# Patient Record
Sex: Female | Born: 1990 | Hispanic: No | Marital: Married | Smoking: Current every day smoker
Health system: Southern US, Community
[De-identification: ages and names within clinical notes are randomized; demographics above are authoritative.]

## PROBLEM LIST (undated history)

## (undated) DIAGNOSIS — Z789 Other specified health status: Secondary | ICD-10-CM

---
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
## 2012-04-25 ENCOUNTER — Emergency Department (HOSPITAL_COMMUNITY)
Admission: EM | Admit: 2012-04-25 | Discharge: 2012-04-25 | Disposition: A | Discharge status: Home / Self Care | Payer: No Typology Code available for payment source | Attending: Emergency Medicine | Admitting: Emergency Medicine

## 2012-04-25 ENCOUNTER — Encounter (HOSPITAL_COMMUNITY): Payer: Self-pay | Admitting: Emergency Medicine

## 2012-04-25 DIAGNOSIS — Z888 Allergy status to other drugs, medicaments and biological substances status: Secondary | ICD-10-CM | POA: Insufficient documentation

## 2012-04-25 DIAGNOSIS — Y92410 Unspecified street and highway as the place of occurrence of the external cause: Secondary | ICD-10-CM | POA: Insufficient documentation

## 2012-04-25 DIAGNOSIS — S139XXA Sprain of joints and ligaments of unspecified parts of neck, initial encounter: Secondary | ICD-10-CM | POA: Insufficient documentation

## 2012-04-25 DIAGNOSIS — F17200 Nicotine dependence, unspecified, uncomplicated: Secondary | ICD-10-CM | POA: Insufficient documentation

## 2012-04-25 DIAGNOSIS — S161XXA Strain of muscle, fascia and tendon at neck level, initial encounter: Secondary | ICD-10-CM

## 2012-04-25 DIAGNOSIS — S0990XA Unspecified injury of head, initial encounter: Secondary | ICD-10-CM

## 2012-04-25 MED ORDER — IBUPROFEN 600 MG PO TABS: 600.0000 mg | ORAL_TABLET | Freq: Three times a day (TID) | ORAL | Status: DC

## 2012-04-25 MED ORDER — IBUPROFEN 400 MG PO TABS
400.0000 mg | ORAL_TABLET | Freq: Once | ORAL | Status: AC
  Administered 2012-04-25: 400 mg via ORAL
  Filled 2012-04-25: qty 1

## 2012-04-25 MED ORDER — HYDROCODONE-ACETAMINOPHEN 5-325 MG PO TABS: ORAL_TABLET | ORAL | Status: DC

## 2012-04-25 MED ORDER — CYCLOBENZAPRINE HCL 5 MG PO TABS: 5.0000 mg | ORAL_TABLET | Freq: Three times a day (TID) | ORAL | Status: DC | PRN

## 2012-04-25 MED ORDER — CYCLOBENZAPRINE HCL 10 MG PO TABS
10.0000 mg | ORAL_TABLET | Freq: Once | ORAL | Status: AC
  Administered 2012-04-25: 10 mg via ORAL
  Filled 2012-04-25: qty 1

## 2012-04-25 MED ORDER — HYDROCODONE-ACETAMINOPHEN 5-325 MG PO TABS
1.0000 | ORAL_TABLET | Freq: Once | ORAL | Status: AC
  Administered 2012-04-25: 1 via ORAL
  Filled 2012-04-25: qty 1
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
## 2012-04-25 NOTE — Discharge Instructions (Signed)
Cervical Sprain A cervical sprain is an injury in the neck in which the ligaments are stretched or torn. The ligaments are the tissues that hold the bones of the neck (vertebrae) in place.Cervical sprains can range from very mild to very severe. Most cervical sprains get better in 1 to 3 weeks, but it depends on the cause and extent of the injury. Severe cervical sprains can cause the neck vertebrae to be unstable. This can lead to damage of the spinal cord and can result in serious nervous system problems. Your caregiver will determine whether your cervical sprain is mild or severe. CAUSES  Severe cervical sprains may be caused by:  Contact sport injuries (football, rugby, wrestling, hockey, auto racing, gymnastics, diving, martial arts, boxing).   Motor vehicle collisions.   Whiplash injuries. This means the neck is forcefully whipped backward and forward.   Falls.  Mild cervical sprains may be caused by:   Awkward positions, such as cradling a telephone between your ear and shoulder.   Sitting in a chair that does not offer proper support.   Working at a poorly Marketing executive station.   Activities that require looking up or down for long periods of time.  SYMPTOMS   Pain, soreness, stiffness, or a burning sensation in the front, back, or sides of the neck. This discomfort may develop immediately after injury or it may develop slowly and not begin for 24 hours or more after an injury.   Pain or tenderness directly in the middle of the back of the neck.   Shoulder or upper back pain.   Limited ability to move the neck.   Headache.   Dizziness.   Weakness, numbness, or tingling in the hands or arms.   Muscle spasms.   Difficulty swallowing or chewing.   Tenderness and swelling of the neck.  DIAGNOSIS  Most of the time, your caregiver can diagnose this problem by taking your history and doing a physical exam. Your caregiver will ask about any known problems, such as  arthritis in the neck or a previous neck injury. X-rays may be taken to find out if there are any other problems, such as problems with the bones of the neck. However, an X-ray often does not reveal the full extent of a cervical sprain. Other tests such as a computed tomography (CT) scan or magnetic resonance imaging (MRI) may be needed. TREATMENT  Treatment depends on the severity of the cervical sprain. Mild sprains can be treated with rest, keeping the neck in place (immobilization), and pain medicines. Severe cervical sprains need immediate immobilization and an appointment with an orthopedist or neurosurgeon. Several treatment options are available to help with pain, muscle spasms, and other symptoms. Your caregiver may prescribe:  Medicines, such as pain relievers, numbing medicines, or muscle relaxants.   Physical therapy. This can include stretching exercises, strengthening exercises, and posture training. Exercises and improved posture can help stabilize the neck, strengthen muscles, and help stop symptoms from returning.   A neck collar to be worn for short periods of time. Often, these collars are worn for comfort. However, certain collars may be worn to protect the neck and prevent further worsening of a serious cervical sprain.  HOME CARE INSTRUCTIONS   Put ice on the injured area.   Put ice in a plastic bag.   Place a towel between your skin and the bag.   Leave the ice on for 15 to 20 minutes, 3 to 4 times a day.  Only take over-the-counter or prescription medicines for pain, discomfort, or fever as directed by your caregiver.   Keep all follow-up appointments as directed by your caregiver.   Keep all physical therapy appointments as directed by your caregiver.   If a neck collar is prescribed, wear it as directed by your caregiver.   Do not drive while wearing a neck collar.   Make any needed adjustments to your work station to promote good posture.   Avoid positions  and activities that make your symptoms worse.   Warm up and stretch before being active to help prevent problems.  SEEK MEDICAL CARE IF:   Your pain is not controlled with medicine.   You are unable to decrease your pain medicine over time as planned.   Your activity level is not improving as expected.  SEEK IMMEDIATE MEDICAL CARE IF:   You develop any bleeding, stomach upset, or signs of an allergic reaction to your medicine.   Your symptoms get worse.   You develop new, unexplained symptoms.   You have numbness, tingling, weakness, or paralysis in any part of your body.  MAKE SURE YOU:   Understand these instructions.   Will watch your condition.   Will get help right away if you are not doing well or get worse.  Document Released: 05/16/2007 Document Revised: 07/08/2011 Document Reviewed: 04/21/2011 [employer] Patient Information 2012 [place], [place].    Head Injury, Adult You have had a head injury that does not appear serious at this time. A concussion is a state of changed mental ability, usually from a blow to the head. You should take clear liquids for the rest of the day and then resume your regular diet. You should not take sedatives or alcoholic beverages for as long as directed by your caregiver after discharge. After injuries such as yours, most problems occur within the first 24 hours. SYMPTOMS These minor symptoms may be experienced after discharge:  Memory difficulties.   Dizziness.   Headaches.   Double vision.   Hearing difficulties.   Depression.   Tiredness.   Weakness.   Difficulty with concentration.  If you experience any of these problems, you should not be alarmed. A concussion requires a few days for recovery. Many patients with head injuries frequently experience such symptoms. Usually, these problems disappear without medical care. If symptoms last for more than one day, notify your caregiver. See your caregiver sooner if symptoms are  becoming worse rather than better. HOME CARE INSTRUCTIONS   During the next 24 hours you must stay with someone who can watch you for the warning signs listed below.  Although it is unlikely that serious side effects will occur, you should be aware of signs and symptoms which may necessitate your return to this location. Side effects may occur up to 7 - 10 days following the injury. It is important for you to carefully monitor your condition and contact your caregiver or seek immediate medical attention if there is a change in your condition. SEEK IMMEDIATE MEDICAL CARE IF:   There is confusion or drowsiness.   You can not awaken the injured person.   There is nausea (feeling sick to your stomach) or continued, forceful vomiting.   You notice dizziness or unsteadiness which is getting worse, or inability to walk.   You have convulsions or unconsciousness.   You experience severe, persistent headaches not relieved by over-the-counter or prescription medicines for pain. (Do not take aspirin as this impairs clotting abilities). Take other  pain medications only as directed.   You can not use arms or legs normally.   There is clear or bloody discharge from the nose or ears.  MAKE SURE YOU:   Understand these instructions.   Will watch your condition.   Will get help right away if you are not doing well or get worse.  Document Released: 07/19/2005 Document Revised: 07/08/2011 Document Reviewed: 06/06/2009 [facility] Patient Information 2012 [place], [place].    Narcotic and benzodiazepine use may cause drowsiness, slowed breathing or dependence.  Please use with caution and do not drive, operate machinery or watch young children alone while taking them.  Taking combinations of these medications or drinking alcohol will potentiate these effects.
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
## 2013-05-29 ENCOUNTER — Encounter (INDEPENDENT_AMBULATORY_CARE_PROVIDER_SITE_OTHER): Payer: Self-pay

## 2013-05-29 ENCOUNTER — Ambulatory Visit (INDEPENDENT_AMBULATORY_CARE_PROVIDER_SITE_OTHER): Payer: BC Managed Care – PPO | Admitting: Family Medicine

## 2013-05-29 VITALS — BP 119/72 | HR 68 | Temp 97.1°F | Wt 167.8 lb

## 2013-05-29 DIAGNOSIS — R51 Headache: Secondary | ICD-10-CM

## 2013-05-29 DIAGNOSIS — R05 Cough: Secondary | ICD-10-CM

## 2013-05-29 DIAGNOSIS — R509 Fever, unspecified: Secondary | ICD-10-CM

## 2013-05-29 DIAGNOSIS — J029 Acute pharyngitis, unspecified: Secondary | ICD-10-CM

## 2013-05-29 LAB — POCT INFLUENZA A/B
Influenza A, POC: NEGATIVE
Influenza B, POC: NEGATIVE

## 2013-05-29 LAB — POCT RAPID STREP A (OFFICE): Rapid Strep A Screen: NEGATIVE

## 2013-05-29 MED ORDER — AZITHROMYCIN 250 MG PO TABS: ORAL_TABLET | ORAL | Status: DC

## 2013-05-29 NOTE — Progress Notes (Signed)
  Subjective:    Patient ID: Marilyn Thompson, female    DOB: February 21, 1991, 22 y.o.   MRN: [id number]  HPI This 22 y.o. female presents for evaluation of URI sx's.  She is having some fever Chills, body aches, and cough.  Her cough is productive.  She is a smoker.   Review of Systems C/o congestion, cough. No chest pain, SOB, HA, dizziness, vision change, N/V, diarrhea, constipation, dysuria, urinary urgency or frequency, myalgias, arthralgias or rash.     Objective:   Physical Exam Vital signs noted  Well developed well nourished female.  HEENT - Head atraumatic Normocephalic                Eyes - PERRLA, Conjuctiva - clear Sclera- Clear EOMI                Ears - EAC's Wnl TM's Wnl Gross Hearing WNL                Nose - Nares patent                 Throat - oropharanx wnl Respiratory - Lungs CTA bilateral Cardiac - RRR S1 and S2 without murmur GI - Abdomen soft Nontender and bowel sounds active x 4 Extremities - No edema. Neuro - Grossly intact.       Assessment & Plan:  Cough - Plan: POCT rapid strep A, POCT Influenza A/B, azithromycin (ZITHROMAX) 250 MG tablet  Fever - Plan: POCT rapid strep A, POCT Influenza A/B, azithromycin (ZITHROMAX) 250 MG tablet  Sore throat - Plan: POCT rapid strep A, POCT Influenza A/B, azithromycin (ZITHROMAX) 250 MG tablet  Headache(784.0) - Plan: POCT rapid strep A, POCT Influenza A/B, azithromycin (ZITHROMAX) 250 MG tablet  Push po fluids, rest, tylenol and motrin otc prn as directed.  WSWG's.    Deatra Canter FNP

## 2013-05-29 NOTE — Patient Instructions (Signed)

## 2015-08-03 NOTE — L&D Delivery Note (Signed)
Delivery Note At 12:05 PM a viable female was delivered via Vaginal, Spontaneous Delivery (Presentation: ROA  ).  APGAR: 8, 9; weight pending. Placenta status: S/I  Cord: 3V with the following complications: none.  Cord pH: n/a  Anesthesia: IV fentanyl  Episiotomy: None Lacerations: Right Labial Suture Repair: 3.0 vicryl rapide Est. Blood Loss (mL): 300  Mom to postpartum.  Baby to Couplet care / Skin to Skin.  Jasan Doughtie 03/10/2016, 12:32 PM

## 2015-08-07 LAB — OB RESULTS CONSOLE HIV ANTIBODY (ROUTINE TESTING): HIV: NONREACTIVE

## 2015-08-07 LAB — OB RESULTS CONSOLE ABO/RH: RH TYPE: NEGATIVE

## 2015-08-07 LAB — OB RESULTS CONSOLE HEPATITIS B SURFACE ANTIGEN: HEP B S AG: NEGATIVE

## 2015-08-07 LAB — OB RESULTS CONSOLE RPR: RPR: NONREACTIVE

## 2015-08-07 LAB — OB RESULTS CONSOLE ANTIBODY SCREEN: Antibody Screen: NEGATIVE

## 2015-08-07 LAB — OB RESULTS CONSOLE RUBELLA ANTIBODY, IGM: RUBELLA: IMMUNE

## 2016-03-01 LAB — OB RESULTS CONSOLE GBS: STREP GROUP B AG: NEGATIVE

## 2016-03-05 ENCOUNTER — Inpatient Hospital Stay (HOSPITAL_COMMUNITY)
Admission: AD | Admit: 2016-03-05 | Discharge: 2016-03-05 | Disposition: A | Discharge status: Home / Self Care | Payer: Commercial Managed Care - PPO | Source: Ambulatory Visit | Attending: Obstetrics & Gynecology | Admitting: Obstetrics & Gynecology

## 2016-03-05 ENCOUNTER — Encounter (HOSPITAL_COMMUNITY): Payer: Self-pay

## 2016-03-05 DIAGNOSIS — Z3493 Encounter for supervision of normal pregnancy, unspecified, third trimester: Secondary | ICD-10-CM | POA: Insufficient documentation

## 2016-03-05 DIAGNOSIS — Z3A38 38 weeks gestation of pregnancy: Secondary | ICD-10-CM | POA: Insufficient documentation

## 2016-03-05 LAB — AMNISURE RUPTURE OF MEMBRANE (ROM) NOT AT ARMC: AMNISURE: NEGATIVE

## 2016-03-05 NOTE — MAU Note (Signed)
Urine in lab 

## 2016-03-05 NOTE — MAU Note (Signed)
Pt received to MAU c/o possible rupture of membranes. Pt states that she has felt a "wet" feeling since 1200 yesterday. Patient denies vaginal bleeding or contraction. Pt placed on EFM FHR 150. Carmelina Dane, RN

## 2016-03-08 ENCOUNTER — Encounter (HOSPITAL_COMMUNITY): Payer: Self-pay

## 2016-03-08 ENCOUNTER — Inpatient Hospital Stay (HOSPITAL_COMMUNITY)
Admission: AD | Admit: 2016-03-08 | Discharge: 2016-03-08 | Disposition: A | Discharge status: Home / Self Care | Payer: Commercial Managed Care - PPO | Source: Ambulatory Visit | Attending: Obstetrics and Gynecology | Admitting: Obstetrics and Gynecology

## 2016-03-08 DIAGNOSIS — Z3A38 38 weeks gestation of pregnancy: Secondary | ICD-10-CM | POA: Insufficient documentation

## 2016-03-08 DIAGNOSIS — O471 False labor at or after 37 completed weeks of gestation: Secondary | ICD-10-CM | POA: Insufficient documentation

## 2016-03-08 HISTORY — DX: Other specified health status: Z78.9

## 2016-03-08 NOTE — MAU Note (Signed)
Pt c/o contractions every 3 minutes or closer since 8am. Pt denies bleeding and leaking of fluid. Pt states baby is moving normally.

## 2016-03-09 ENCOUNTER — Inpatient Hospital Stay (HOSPITAL_COMMUNITY)
Admission: AD | Admit: 2016-03-09 | Discharge: 2016-03-11 | DRG: 775 | Disposition: A | Discharge status: Home / Self Care | Payer: Commercial Managed Care - PPO | Source: Ambulatory Visit | Attending: Obstetrics & Gynecology | Admitting: Obstetrics & Gynecology

## 2016-03-09 DIAGNOSIS — O360930 Maternal care for other rhesus isoimmunization, third trimester, not applicable or unspecified: Secondary | ICD-10-CM | POA: Diagnosis present

## 2016-03-09 DIAGNOSIS — F17210 Nicotine dependence, cigarettes, uncomplicated: Secondary | ICD-10-CM | POA: Diagnosis present

## 2016-03-09 DIAGNOSIS — Z23 Encounter for immunization: Secondary | ICD-10-CM

## 2016-03-09 DIAGNOSIS — Z3490 Encounter for supervision of normal pregnancy, unspecified, unspecified trimester: Secondary | ICD-10-CM

## 2016-03-09 DIAGNOSIS — Z3A38 38 weeks gestation of pregnancy: Secondary | ICD-10-CM

## 2016-03-09 DIAGNOSIS — O99334 Smoking (tobacco) complicating childbirth: Secondary | ICD-10-CM | POA: Diagnosis present

## 2016-03-10 ENCOUNTER — Encounter (HOSPITAL_COMMUNITY): Payer: Self-pay | Admitting: *Deleted

## 2016-03-10 DIAGNOSIS — Z23 Encounter for immunization: Secondary | ICD-10-CM | POA: Diagnosis not present

## 2016-03-10 DIAGNOSIS — F17210 Nicotine dependence, cigarettes, uncomplicated: Secondary | ICD-10-CM | POA: Diagnosis present

## 2016-03-10 DIAGNOSIS — Z3490 Encounter for supervision of normal pregnancy, unspecified, unspecified trimester: Secondary | ICD-10-CM

## 2016-03-10 DIAGNOSIS — O99334 Smoking (tobacco) complicating childbirth: Secondary | ICD-10-CM | POA: Diagnosis present

## 2016-03-10 DIAGNOSIS — O360930 Maternal care for other rhesus isoimmunization, third trimester, not applicable or unspecified: Secondary | ICD-10-CM | POA: Diagnosis present

## 2016-03-10 DIAGNOSIS — Z3A38 38 weeks gestation of pregnancy: Secondary | ICD-10-CM | POA: Diagnosis not present

## 2016-03-10 LAB — CBC
HEMATOCRIT: 35.3 % — AB (ref 36.0–46.0)
HEMOGLOBIN: 12.2 g/dL (ref 12.0–15.0)
MCH: 30.4 pg (ref 26.0–34.0)
MCHC: 34.6 g/dL (ref 30.0–36.0)
MCV: 88 fL (ref 78.0–100.0)
Platelets: 237 10*3/uL (ref 150–400)
RBC: 4.01 MIL/uL (ref 3.87–5.11)
RDW: 13.4 % (ref 11.5–15.5)
WBC: 10.2 10*3/uL (ref 4.0–10.5)

## 2016-03-10 LAB — AMNISURE RUPTURE OF MEMBRANE (ROM) NOT AT ARMC: Amnisure ROM: NEGATIVE

## 2016-03-10 MED ORDER — WITCH HAZEL-GLYCERIN EX PADS: 1.0000 "application " | MEDICATED_PAD | CUTANEOUS | Status: DC | PRN

## 2016-03-10 MED ORDER — OXYTOCIN BOLUS FROM INFUSION: 500.0000 mL | Freq: Once | INTRAVENOUS | Status: DC

## 2016-03-10 MED ORDER — FLEET ENEMA 7-19 GM/118ML RE ENEM: 1.0000 | ENEMA | RECTAL | Status: DC | PRN

## 2016-03-10 MED ORDER — OXYCODONE-ACETAMINOPHEN 5-325 MG PO TABS: 2.0000 | ORAL_TABLET | ORAL | Status: DC | PRN

## 2016-03-10 MED ORDER — ONDANSETRON HCL 4 MG PO TABS: 4.0000 mg | ORAL_TABLET | ORAL | Status: DC | PRN

## 2016-03-10 MED ORDER — BENZOCAINE-MENTHOL 20-0.5 % EX AERO
1.0000 "application " | INHALATION_SPRAY | CUTANEOUS | Status: DC | PRN
  Administered 2016-03-10: 1 via TOPICAL
  Filled 2016-03-10: qty 56

## 2016-03-10 MED ORDER — LIDOCAINE HCL (PF) 1 % IJ SOLN
30.0000 mL | INTRAMUSCULAR | Status: DC | PRN
  Filled 2016-03-10: qty 30

## 2016-03-10 MED ORDER — ZOLPIDEM TARTRATE 5 MG PO TABS: 5.0000 mg | ORAL_TABLET | Freq: Every evening | ORAL | Status: DC | PRN

## 2016-03-10 MED ORDER — SOD CITRATE-CITRIC ACID 500-334 MG/5ML PO SOLN: 30.0000 mL | ORAL | Status: DC | PRN

## 2016-03-10 MED ORDER — ONDANSETRON HCL 4 MG/2ML IJ SOLN: 4.0000 mg | Freq: Four times a day (QID) | INTRAMUSCULAR | Status: DC | PRN

## 2016-03-10 MED ORDER — ONDANSETRON HCL 4 MG/2ML IJ SOLN: 4.0000 mg | INTRAMUSCULAR | Status: DC | PRN

## 2016-03-10 MED ORDER — ACETAMINOPHEN 325 MG PO TABS: 650.0000 mg | ORAL_TABLET | ORAL | Status: DC | PRN

## 2016-03-10 MED ORDER — LACTATED RINGERS IV SOLN
INTRAVENOUS | Status: DC
  Administered 2016-03-10: 06:00:00 via INTRAVENOUS

## 2016-03-10 MED ORDER — TETANUS-DIPHTH-ACELL PERTUSSIS 5-2.5-18.5 LF-MCG/0.5 IM SUSP: 0.5000 mL | Freq: Once | INTRAMUSCULAR | Status: DC

## 2016-03-10 MED ORDER — DIPHENHYDRAMINE HCL 25 MG PO CAPS: 25.0000 mg | ORAL_CAPSULE | Freq: Four times a day (QID) | ORAL | Status: DC | PRN

## 2016-03-10 MED ORDER — LACTATED RINGERS IV SOLN
500.0000 mL | INTRAVENOUS | Status: DC | PRN
  Administered 2016-03-10: 500 mL via INTRAVENOUS

## 2016-03-10 MED ORDER — OXYTOCIN 40 UNITS IN LACTATED RINGERS INFUSION - SIMPLE MED
1.0000 m[IU]/min | INTRAVENOUS | Status: DC
  Administered 2016-03-10: 1 m[IU]/min via INTRAVENOUS
  Filled 2016-03-10: qty 1000

## 2016-03-10 MED ORDER — ACETAMINOPHEN 325 MG PO TABS
650.0000 mg | ORAL_TABLET | ORAL | Status: DC | PRN
  Administered 2016-03-10: 650 mg via ORAL
  Filled 2016-03-10: qty 2

## 2016-03-10 MED ORDER — PRENATAL MULTIVITAMIN CH
1.0000 | ORAL_TABLET | Freq: Every day | ORAL | Status: DC
  Administered 2016-03-10 – 2016-03-11 (×3): 1 via ORAL
  Filled 2016-03-10 (×2): qty 1

## 2016-03-10 MED ORDER — SENNOSIDES-DOCUSATE SODIUM 8.6-50 MG PO TABS
2.0000 | ORAL_TABLET | ORAL | Status: DC
  Administered 2016-03-10: 2 via ORAL
  Filled 2016-03-10: qty 2

## 2016-03-10 MED ORDER — OXYCODONE-ACETAMINOPHEN 5-325 MG PO TABS: 1.0000 | ORAL_TABLET | ORAL | Status: DC | PRN

## 2016-03-10 MED ORDER — DIBUCAINE 1 % RE OINT: 1.0000 "application " | TOPICAL_OINTMENT | RECTAL | Status: DC | PRN

## 2016-03-10 MED ORDER — OXYTOCIN 40 UNITS IN LACTATED RINGERS INFUSION - SIMPLE MED: 2.5000 [IU]/h | INTRAVENOUS | Status: DC

## 2016-03-10 MED ORDER — COCONUT OIL OIL: 1.0000 "application " | TOPICAL_OIL | Status: DC | PRN

## 2016-03-10 MED ORDER — OXYCODONE-ACETAMINOPHEN 5-325 MG PO TABS
1.0000 | ORAL_TABLET | ORAL | Status: DC | PRN
Start: 2016-03-10 — End: 2016-03-11

## 2016-03-10 MED ORDER — IBUPROFEN 600 MG PO TABS
600.0000 mg | ORAL_TABLET | Freq: Four times a day (QID) | ORAL | Status: DC
  Administered 2016-03-10 – 2016-03-11 (×5): 600 mg via ORAL
  Filled 2016-03-10 (×5): qty 1

## 2016-03-10 MED ORDER — SIMETHICONE 80 MG PO CHEW: 80.0000 mg | CHEWABLE_TABLET | ORAL | Status: DC | PRN

## 2016-03-10 MED ORDER — FENTANYL CITRATE (PF) 100 MCG/2ML IJ SOLN
50.0000 ug | INTRAMUSCULAR | Status: DC | PRN
  Administered 2016-03-10: 100 ug via INTRAVENOUS
  Filled 2016-03-10: qty 2

## 2016-03-10 MED ORDER — TERBUTALINE SULFATE 1 MG/ML IJ SOLN: 0.2500 mg | Freq: Once | INTRAMUSCULAR | Status: DC | PRN

## 2016-03-10 NOTE — Progress Notes (Signed)
I was called to patient's room due to prolonged fetal deceleration. Pt in knee chest position with Oxygen 10L via mask. FHT currently in 110s. SVE performed by RN prior to my arrival & pt found to be 3/70/-3 vtx.  I called Dr. Vincente PoliGrewal for the RN & informed her of fetal deceleration, current FHT, & SVE. Will admit pt to L&D.   Judeth HornErin Gazella Anglin, NP

## 2016-03-10 NOTE — Anesthesia Pain Management Evaluation Note (Signed)
  CRNA Pain Management Visit Note  Patient: Marilyn Thompson, 25 y.o., female  "Hello I am a member of the anesthesia team at [facility]. We have an anesthesia team available at all times to provide care throughout the hospital, including epidural management and anesthesia for C-section. I don't know your plan for the delivery whether it a natural birth, water birth, IV sedation, nitrous supplementation, doula or epidural, but we want to meet your pain goals."   1.Was your pain managed to your expectations on prior hospitalizations?   Yes   2.What is your expectation for pain management during this hospitalization?     Epidural  3.How can we help you reach that goal?  Explain options for pain control in labor  Record the patient's initial score and the patient's pain goal.   Pain: 3  Pain Goal: 8 [facility] wants you to be able to say your pain was always managed very well.  Modesta Sammons 03/10/2016

## 2016-03-10 NOTE — H&P (Signed)
Elonda HuskyCassandra Tonche is a 25 y.o. female presenting for questionable PROM to MAU last night.  ROM was ruled out but during eval, the patient had a 7 minute deceleration and was admitted for induction.  Her CEFM has been reassuring since that time. The patient was started on low dose pitocin overnight and is feeling mild CTX this AM.  Active FM. Antepartum course complicated by tobacco use.  Rh negative.  GBS negative.  OB History    Gravida Para Term Preterm AB Living   2 0 0 0 1 0   SAB TAB Ectopic Multiple Live Births     1           Past Medical History:  Diagnosis Date  . Medical history non-contributory    Past Surgical History:  Procedure Laterality Date  . TONSILLECTOMY  1994   Family History: family history includes Cancer in her paternal grandmother; Diabetes in her paternal grandmother. Social History:  reports that she has been smoking Cigarettes.  She has a 2.25 pack-year smoking history. She has never used smokeless tobacco. She reports that she drinks alcohol. She reports that she does not use drugs.     Maternal Diabetes: No Genetic Screening: Normal Maternal Ultrasounds/Referrals: Normal Fetal Ultrasounds or other Referrals:  None Maternal Substance Abuse:  Yes:  Type: Smoker Significant Maternal Medications:  None Significant Maternal Lab Results:  Lab values include: Group B Strep negative, Rh negative Other Comments:  None  ROS Maternal Medical History:  Reason for admission: Contractions.   Contractions: Onset was 6-12 hours ago.   Frequency: rare.   Perceived severity is mild.    Fetal activity: Perceived fetal activity is normal.   Last perceived fetal movement was within the past hour.    Prenatal complications: no prenatal complications Prenatal Complications - Diabetes: none.    Dilation: 3 Effacement (%): Thick Station: -3 Exam by:: Rogelio SeenMcCall, RN   AROM, clear  Blood pressure (!) 95/53, pulse (!) 56, temperature 97.7 F (36.5 C), temperature  source Oral, resp. rate 16, height 5\' 5"  (1.651 m), weight 211 lb (95.7 kg), last menstrual period 06/27/2015. Maternal Exam:  Uterine Assessment: Contraction strength is mild.  Contraction frequency is irregular.   Abdomen: Patient reports no abdominal tenderness. Fundal height is c/w dates.   Estimated fetal weight is 7#.   Fetal presentation: vertex  Introitus: Normal vulva. Amniotic fluid character: clear.  Pelvis: adequate for delivery.   Cervix: Cervix evaluated by digital exam.     Physical Exam  Constitutional: She is oriented to person, place, and time. She appears well-developed and well-nourished.  GI: Soft. There is no rebound and no guarding.  Neurological: She is alert and oriented to person, place, and time.  Skin: Skin is warm and dry.  Psychiatric: She has a normal mood and affect. Her behavior is normal.    Prenatal labs: ABO, Rh: --/--/O NEG (08/09 0040) Antibody: POS (08/09 0040) Rubella: Immune (01/05 0000) RPR: Nonreactive (01/05 0000)  HBsAg: Negative (01/05 0000)  HIV: Non-reactive (01/05 0000)  GBS: Negative (07/31 0000)   Assessment/Plan: 25yo G2P0 at 6536w5d for IOL secondary to prolonged decel  -Continue pitocin -Epidural when ready -Anticipate NSVD    Teng Decou 03/10/2016, 8:09 AM

## 2016-03-10 NOTE — Progress Notes (Signed)
MD called to review tracing with RN. Baseline FHR, variability, accelerations and 1 variable deceleration with contractions reviewed.

## 2016-03-10 NOTE — Lactation Note (Signed)
This note was copied from a baby's chart. Lactation Consultation Note Initial visit at this time.  Mom reports just finishing a feedings STS after bath.  Mom denies pain and is reporting hearing swallows with feedings and baby releases the breast on her own.  Baby is 5#15 oz and mom admits she was a smoker and plans to quit now.  LC discussed smoking, second hand smoke and affects with breastfeeding.  FOB at bedside supportive and plans to quit also.  [facility] LC resources given and discussed.  Encouraged to feed with early cues on demand.  Early newborn behavior discussed.  Hand expression demonstrated with small drop of colostrum visible. Discussed pumping, returning to work and basics of breastfeeding.   [facility] and assist as needed.  Patient Name: Girl Elonda HuskyCassandra Abdo Today's Date: 03/10/2016 Reason for consult: Initial assessment   Maternal Data Has patient been taught Hand Expression?: Yes Does the patient have breastfeeding experience prior to this delivery?: No  Feeding Feeding Type: Breast Fed Length of feed: 30 min  LATCH Score/Interventions                Intervention(s): Breastfeeding basics reviewed;Skin to skin;Position options     Lactation Tools Discussed/Used     Consult Status Consult Status: Follow-up Date: 03/11/16 Follow-up type: In-patient    Beverely RisenShoptaw, Arvella MerlesJana Lynn 03/10/2016, 9:41 PM

## 2016-03-11 ENCOUNTER — Ambulatory Visit: Payer: Self-pay

## 2016-03-11 LAB — CBC
HCT: 30.2 % — ABNORMAL LOW (ref 36.0–46.0)
Hemoglobin: 10.3 g/dL — ABNORMAL LOW (ref 12.0–15.0)
MCH: 30.3 pg (ref 26.0–34.0)
MCHC: 34.1 g/dL (ref 30.0–36.0)
MCV: 88.8 fL (ref 78.0–100.0)
PLATELETS: 196 10*3/uL (ref 150–400)
RBC: 3.4 MIL/uL — ABNORMAL LOW (ref 3.87–5.11)
RDW: 13.5 % (ref 11.5–15.5)
WBC: 10.5 10*3/uL (ref 4.0–10.5)

## 2016-03-11 LAB — RPR: RPR: NONREACTIVE

## 2016-03-11 MED ORDER — RHO D IMMUNE GLOBULIN 1500 UNIT/2ML IJ SOSY
300.0000 ug | PREFILLED_SYRINGE | Freq: Once | INTRAMUSCULAR | Status: AC
  Administered 2016-03-11: 300 ug via INTRAMUSCULAR
  Filled 2016-03-11: qty 2

## 2016-03-11 MED ORDER — IBUPROFEN 600 MG PO TABS: 600.0000 mg | ORAL_TABLET | Freq: Four times a day (QID) | ORAL | 0 refills | Status: DC | PRN

## 2016-03-11 NOTE — Progress Notes (Signed)
Patient wants discharge today. Will discharge this afternoon.

## 2016-03-11 NOTE — Lactation Note (Signed)
This note was copied from a baby's chart. Lactation Consultation Note  Patient Name: Marilyn Elonda HuskyCassandra Hughett Today's Date: 03/11/2016 Reason for consult: Follow-up assessment Baby at 32 hr of life. Mom reports baby is feeding better today. She is staying awake longer at the breast. Mom is reporting bilateral nipple soreness. No skin break down noted. Mom was using the cradle hold leaning over baby who was laying on the pillows. Had mom move to cross cradle, lean back, and bring support pillows up the breast level. Mom stated latch felt better when she pulled baby in closer. Encouraged frequent feedings and manual expression after feedings. FOB at bedside and helps with latch. Parents are aware of lactation services and support group. They will call as needed.   Maternal Data    Feeding Feeding Type: Breast Fed Length of feed: 10 min  LATCH Score/Interventions Latch: Grasps breast easily, tongue down, lips flanged, rhythmical sucking. Intervention(s): Adjust position;Breast compression  Audible Swallowing: Spontaneous and intermittent Intervention(s): Hand expression  Type of Nipple: Everted at rest and after stimulation  Comfort (Breast/Nipple): Filling, red/small blisters or bruises, mild/mod discomfort  Problem noted: Mild/Moderate discomfort Interventions (Mild/moderate discomfort): Hand expression  Hold (Positioning): No assistance needed to correctly position infant at breast. Intervention(s): Support Pillows;Position options  LATCH Score: 9  Lactation Tools Discussed/Used WIC Program: No   Consult Status Consult Status: Follow-up Date: 03/12/16 Follow-up type: In-patient    Rulon Eisenmengerlizabeth E Cydnee Fuquay 03/11/2016, 8:34 PM

## 2016-03-11 NOTE — Discharge Summary (Signed)
Obstetric Discharge Summary Reason for Admission: rupture of membranes Prenatal Procedures: none Intrapartum Procedures: spontaneous vaginal delivery Postpartum Procedures: none Complications-Operative and Postpartum: none Hemoglobin  Date Value Ref Range Status  03/11/2016 10.3 (L) 12.0 - 15.0 g/dL Final   HCT  Date Value Ref Range Status  03/11/2016 30.2 (L) 36.0 - 46.0 % Final    Physical Exam:  General: alert, cooperative and no distress Lochia: appropriate Uterine Fundus: firm Incision: healing well DVT Evaluation: No evidence of DVT seen on physical exam.  Discharge Diagnoses: Term Pregnancy-delivered  Discharge Information: Date: 03/11/2016 Activity: pelvic rest Diet: routine Medications: PNV and Ibuprofen Condition: stable Instructions: refer to practice specific booklet Discharge to: home   Newborn Data: Live born female  Birth Weight: 5 lb 15.1 oz (2695 g) APGAR: 8, 9  Home with mother.  Marilyn Thompson,Marilyn Thompson E 03/11/2016, 8:59 AM

## 2016-03-11 NOTE — Progress Notes (Signed)
Post Partum Day 1 Subjective: no complaints, up ad lib, voiding, tolerating PO and + flatus  Objective: Blood pressure 112/66, pulse 63, temperature 98 F (36.7 C), resp. rate 18, height 5\' 5"  (1.651 m), weight 211 lb (95.7 kg), last menstrual period 06/27/2015, unknown if currently breastfeeding.  Physical Exam:  General: alert and cooperative Lochia: appropriate Uterine Fundus: firm Incision: healing well DVT Evaluation: No evidence of DVT seen on physical exam. Negative Homan's sign. No cords or calf tenderness. No significant calf/ankle edema.   Recent Labs  03/10/16 0040 03/11/16 0511  HGB 12.2 10.3*  HCT 35.3* 30.2*    Assessment/Plan: Plan for discharge tomorrow   LOS: 1 day   Hannah Crill G 03/11/2016, 8:01 AM

## 2016-03-11 NOTE — Discharge Instructions (Signed)
No vaginal entry °

## 2016-03-12 ENCOUNTER — Ambulatory Visit: Payer: Self-pay

## 2016-03-12 LAB — RH IG WORKUP (INCLUDES ABO/RH)
ABO/RH(D): O NEG
Fetal Screen: NEGATIVE
Gestational Age(Wks): 38.5
Unit division: 0

## 2016-03-12 NOTE — Lactation Note (Signed)
This note was copied from a baby's chart. Lactation Consultation Note: Follow up visit with mom before DC. Reports she was very upset about giving formula during the night. Mom was able to pump 30 ml of transitional milk and gave that to baby instead of formula. Reports baby did not like formula and kept spitting it out. Reports breasts are feeling heavier this morning. Has slight crack on right nipple. Encouraged to change positions, make sure she has deep latch. Asking about nipple creams. Encouraged to use EBM or coconut oil instead. No further questions at present. Reviewed our phone number to call with questions/concerns, OP appointments and BFSG. To call prn  Patient Name: Marilyn Thompson Today's Date: 03/12/2016     Maternal Data    Feeding    LATCH Score/Interventions                      Lactation Tools Discussed/Used     Consult Status      Pamelia HoitWeeks, Haston Casebolt D 03/12/2016, 11:53 AM

## 2016-03-12 NOTE — Lactation Note (Addendum)
This note was copied from a baby's chart. Lactation Consultation Note New mom planning on d/c today. States BF going well. Has some soreness but not bad. Noted sm bruise to Rt. Nipple. Baby had a 8% weight loss. Had 11 voids, 8 stools in 42 hrs. Mom seemed agitated and had little eye contact w/LC. Stated she was good and didn't need any help and had no concerns. Discussed needing to supplement d/t weight loss and being 5.7lbs. Discussed giving colostrum or formula as supplement. Encouraged pumping to stimulate lactation d/t small SGA. Mom has DEBP at home. Agrees to pump. Mom shown how to use DEBP & how to disassemble, clean, & reassemble parts. Mom knows to pump q3h for 15-20 min. Mom more attentive and having eye contact. Came back w/supplies, mom tearful. Mom refuses Alimentum d/t she wants Similac if that is what she will supplement on at home until her milk comes in. Mom wants d/c home today. Encouraged to occasionally massage breast during feedings. LPI feeding sheet given. Encouraged BF 1st then supplement. Discussed transitional milk, and mature milk, engorgement, prevention, filling, clogged ducts, and infesctions. Mom asked about nipple cream. Dicussed BM, olive oil or coconut oil. Encouraged to cont. To document I&O until Dr. Alfonzo BeersAppt.  Patient Name: Girl Elonda HuskyCassandra Manahan Today's Date: 03/12/2016 Reason for consult: Follow-up assessment   Maternal Data    Feeding Feeding Type: Breast Fed Length of feed: 20 min  LATCH Score/Interventions       Type of Nipple: Everted at rest and after stimulation  Comfort (Breast/Nipple): Filling, red/small blisters or bruises, mild/mod discomfort  Problem noted: Mild/Moderate discomfort Interventions (Mild/moderate discomfort): Hand massage  Hold (Positioning): No assistance needed to correctly position infant at breast. Intervention(s): Support Pillows;Breastfeeding basics reviewed;Position options;Skin to skin     Lactation Tools  Discussed/Used     Consult Status Consult Status: Complete Date: 03/12/16    Charyl DancerCARVER, Bitania Shankland G 03/12/2016, 6:16 AM

## 2016-03-14 LAB — TYPE AND SCREEN
ABO/RH(D): O NEG
Antibody Screen: POSITIVE
DAT, IgG: NEGATIVE
UNIT DIVISION: 0
Unit division: 0

## 2016-03-15 ENCOUNTER — Telehealth (HOSPITAL_COMMUNITY): Payer: Self-pay

## 2016-03-15 NOTE — Telephone Encounter (Signed)
Opened in error

## 2016-03-15 NOTE — Telephone Encounter (Signed)
Nipples are sore and so patient is pumping and Bottle feeding every other feeding. The baby is now having difficulty latching to the breast.  Suggested continuing to work on latch, come to support group and/or schedule an OP visit.  Baby also gets fussy prior to a bowel movement but once she evacuates she has relief. Discussed comfort measures such as gently massaging her abdomen to help her with the discomfort.

## 2018-02-08 ENCOUNTER — Emergency Department (HOSPITAL_COMMUNITY)
Admission: EM | Admit: 2018-02-08 | Discharge: 2018-02-09 | Disposition: A | Discharge status: Home / Self Care | Payer: Commercial Managed Care - PPO | Attending: Emergency Medicine | Admitting: Emergency Medicine

## 2018-02-08 ENCOUNTER — Other Ambulatory Visit: Payer: Self-pay

## 2018-02-08 ENCOUNTER — Encounter (HOSPITAL_COMMUNITY): Payer: Self-pay | Admitting: *Deleted

## 2018-02-08 DIAGNOSIS — Z79899 Other long term (current) drug therapy: Secondary | ICD-10-CM | POA: Insufficient documentation

## 2018-02-08 DIAGNOSIS — L0591 Pilonidal cyst without abscess: Secondary | ICD-10-CM | POA: Insufficient documentation

## 2018-02-08 DIAGNOSIS — F17210 Nicotine dependence, cigarettes, uncomplicated: Secondary | ICD-10-CM | POA: Insufficient documentation

## 2018-02-08 MED ORDER — ONDANSETRON HCL 4 MG PO TABS
4.0000 mg | ORAL_TABLET | Freq: Once | ORAL | Status: AC
  Administered 2018-02-08: 4 mg via ORAL
  Filled 2018-02-08: qty 1

## 2018-02-08 MED ORDER — DOXYCYCLINE HYCLATE 100 MG PO TABS
100.0000 mg | ORAL_TABLET | Freq: Once | ORAL | Status: AC
  Administered 2018-02-08: 100 mg via ORAL
  Filled 2018-02-08: qty 1

## 2018-02-08 MED ORDER — IBUPROFEN 800 MG PO TABS
800.0000 mg | ORAL_TABLET | Freq: Once | ORAL | Status: AC
  Administered 2018-02-08: 800 mg via ORAL
  Filled 2018-02-08: qty 1

## 2018-02-08 MED ORDER — CEPHALEXIN 500 MG PO CAPS
500.0000 mg | ORAL_CAPSULE | Freq: Once | ORAL | Status: AC
  Administered 2018-02-08: 500 mg via ORAL
  Filled 2018-02-08: qty 1

## 2018-02-08 NOTE — ED Triage Notes (Signed)
Pt c/o ?cyst, tailbone pain that started 3-4 days ago, pt states that the pain has gotten worse,

## 2018-02-08 NOTE — ED Provider Notes (Signed)
[facility] EMERGENCY DEPARTMENT Provider Note   CSN: [id number] Arrival date & time: 02/08/18  2143     History   Chief Complaint Chief Complaint  Patient presents with  . Tailbone Pain    HPI Marilyn Thompson is a 27 y.o. female.  Patient is a 27 year old female who presents to the emergency department with a complaint of tailbone pain.  Patient states that 3 to 4 days ago she noted pain of the area of the tailbone.  She says she has not had any recent injury or trauma to the area.  In the last 2 days she is noticed that this area has gotten larger and more tender to palpation.  The patient states she did have a different feeling in that area after her daughter was born, but that was 2 years ago and she thought that that may been related to delivery.  No recent fever or chills.  No drainage from the area.  No operations or procedures in the buttocks or coccyx area.  Palpation and sitting makes it worse.  Nothing really makes it better.     Past Medical History:  Diagnosis Date  . Medical history non-contributory     Patient Active Problem List   Diagnosis Date Noted  . Pregnancy 03/10/2016    Past Surgical History:  Procedure Laterality Date  . TONSILLECTOMY  1994     OB History    Gravida  2   Para  1   Term  1   Preterm  0   AB  1   Living  1     SAB      TAB  1   Ectopic      Multiple  0   Live Births  1            Home Medications    Prior to Admission medications   Medication Sig Start Date End Date Taking? Authorizing Provider  ibuprofen (ADVIL,MOTRIN) 600 MG tablet Take 1 tablet (600 mg total) by mouth every 6 (six) hours as needed. 03/11/16   Harold Hedgeomblin, James, MD  pantoprazole (PROTONIX) 40 MG tablet Take 40 mg by mouth daily.    [provider]  Prenatal Vit-Fe Fumarate-FA (PRENATAL MULTIVITAMIN) TABS tablet Take 1 tablet by mouth daily at 12 noon.    [provider]    Family History Family History  Problem  Relation Age of Onset  . Cancer Paternal Grandmother   . Diabetes Paternal Grandmother     Social History Social History   Tobacco Use  . Smoking status: Current Every Day Smoker    Packs/day: 0.25    Years: 9.00    Pack years: 2.25    Types: Cigarettes  . Smokeless tobacco: Never Used  Substance Use Topics  . Alcohol use: Yes    Comment: occ  . Drug use: No     Allergies   Demerol [meperidine]   Review of Systems Review of Systems  Constitutional: Negative for activity change, chills, fatigue and fever.       All ROS Neg except as noted in HPI  HENT: Negative for nosebleeds.   Eyes: Negative for photophobia and discharge.  Respiratory: Negative for cough, shortness of breath and wheezing.   Cardiovascular: Negative for chest pain and palpitations.  Gastrointestinal: Negative for abdominal pain and blood in stool.  Genitourinary: Negative for dysuria, frequency and hematuria.       Coccyx/buttocks tenderness  Musculoskeletal: Negative for arthralgias, back pain and neck pain.  Skin: Negative.   Neurological: Negative for dizziness, seizures and speech difficulty.  Psychiatric/Behavioral: Negative for confusion and hallucinations.     Physical Exam Updated Vital Signs BP 130/88   Pulse 82   Temp 98.7 F (37.1 C) (Oral)   Resp 18   Ht 5\' 4"  (1.626 m)   Wt 83.9 kg (185 lb)   LMP 02/05/2018   SpO2 97%   BMI 31.76 kg/m   Physical Exam  Constitutional: She is oriented to person, place, and time. She appears well-developed and well-nourished.  Non-toxic appearance.  HENT:  Head: Normocephalic.  Right Ear: Tympanic membrane and external ear normal.  Left Ear: Tympanic membrane and external ear normal.  Eyes: Pupils are equal, round, and reactive to light. EOM and lids are normal.  Neck: Normal range of motion. Neck supple. Carotid bruit is not present.  Cardiovascular: Normal rate, regular rhythm, normal heart sounds, intact distal pulses and normal pulses.    Pulmonary/Chest: Breath sounds normal. No respiratory distress.  Abdominal: Soft. Bowel sounds are normal. There is no tenderness. There is no guarding.  Genitourinary:    Pelvic exam was performed with patient prone.  Genitourinary Comments: Chaperone present during the examination.  Patient has red raised tender areas on the right and left of the buttocks cleft.  The pain extends approximately 1-1/2 cm into the buttocks cleft area.  The anus is not involved.  There are no red streaks appreciated.  The red raised area is tender to palpation.  No other hot areas in the buttocks area.  Musculoskeletal: Normal range of motion.  Lymphadenopathy:       Head (right side): No submandibular adenopathy present.       Head (left side): No submandibular adenopathy present.    She has no cervical adenopathy.  Neurological: She is alert and oriented to person, place, and time. She has normal strength. No cranial nerve deficit or sensory deficit.  Skin: Skin is warm and dry.  Psychiatric: She has a normal mood and affect. Her speech is normal.  Nursing note and vitals reviewed.    ED Treatments / Results  Labs (all labs ordered are listed, but only abnormal results are displayed) Labs Reviewed - No data to display  EKG None  Radiology No results found.  Procedures Procedures (including critical care time)  Medications Ordered in ED Medications  ibuprofen (ADVIL,MOTRIN) tablet 800 mg (has no administration in time range)  doxycycline (VIBRA-TABS) tablet 100 mg (has no administration in time range)  cephALEXin (KEFLEX) capsule 500 mg (has no administration in time range)  ondansetron (ZOFRAN) tablet 4 mg (has no administration in time range)     Initial Impression / Assessment and Plan / ED Course  I have reviewed the triage vital signs and the nursing notes.  Pertinent labs & imaging results that were available during my care of the patient were reviewed by me and considered in my  medical decision making (see chart for details).       Final Clinical Impressions(s) / ED Diagnoses MDM  Vital signs within normal limits.  Pulse oximetry is 97% on room air.  Within normal limits by my interpretation.  No recent injury or trauma or procedures involving the buttocks/coccyx area.  I suspect the patient has a pilonidal cyst.  The patient will be treated prophylactically in the event that there is an abscess present.  This area is not a candidate for incision and drainage at this time.  I discussed with the patient that  she may need surgical intervention for this particular problem.  The patient will be treated with Keflex and doxycycline.  The patient will use ibuprofen every 6 hours for soreness.  We also discussed the use of warm Epson salt soaks.  The patient will return to the emergency department if any changes, problems, or concerns.   Final diagnoses:  Pilonidal cyst    ED Discharge Orders        Ordered    doxycycline (VIBRAMYCIN) 100 MG capsule  2 times daily     02/09/18 0008    cephALEXin (KEFLEX) 500 MG capsule  4 times daily     02/09/18 0008    ibuprofen (ADVIL,MOTRIN) 600 MG tablet  4 times daily     02/09/18 0008       Ivery Quale, PA-C 02/09/18 Jethro Poling, MD 02/09/18 [id number]

## 2018-02-09 MED ORDER — IBUPROFEN 600 MG PO TABS: 600.0000 mg | ORAL_TABLET | Freq: Four times a day (QID) | ORAL | 0 refills | Status: AC

## 2018-02-09 MED ORDER — CEPHALEXIN 500 MG PO CAPS: 500.0000 mg | ORAL_CAPSULE | Freq: Four times a day (QID) | ORAL | 0 refills | Status: AC

## 2018-02-09 MED ORDER — DOXYCYCLINE HYCLATE 100 MG PO CAPS: 100.0000 mg | ORAL_CAPSULE | Freq: Two times a day (BID) | ORAL | 0 refills | Status: DC

## 2018-02-09 NOTE — Discharge Instructions (Signed)
Your examination favors a pilonidal cyst.  Your vital signs within normal limits.  Please use warm Epson salt soaks for about 15 minutes daily.  Please use doxycycline and Keflex daily.  Use ibuprofen 4 times daily or every 6 hours as needed for pain and discomfort.  Please see Dr. Lovell SheehanJenkins for surgical evaluation of this problem gets worse.  Return to the emergency department if there is temperature elevations, changes in the pain, or changes in your general condition.

## 2019-06-08 ENCOUNTER — Encounter (HOSPITAL_COMMUNITY): Payer: Self-pay | Admitting: Emergency Medicine

## 2019-06-08 ENCOUNTER — Other Ambulatory Visit: Payer: Self-pay

## 2019-06-08 ENCOUNTER — Emergency Department (HOSPITAL_COMMUNITY)
Admission: EM | Admit: 2019-06-08 | Discharge: 2019-06-09 | Disposition: A | Discharge status: Home / Self Care | Payer: Medicaid Other | Attending: Emergency Medicine | Admitting: Emergency Medicine

## 2019-06-08 DIAGNOSIS — Z79899 Other long term (current) drug therapy: Secondary | ICD-10-CM | POA: Diagnosis not present

## 2019-06-08 DIAGNOSIS — R1030 Lower abdominal pain, unspecified: Secondary | ICD-10-CM | POA: Insufficient documentation

## 2019-06-08 DIAGNOSIS — F17210 Nicotine dependence, cigarettes, uncomplicated: Secondary | ICD-10-CM | POA: Insufficient documentation

## 2019-06-08 DIAGNOSIS — N898 Other specified noninflammatory disorders of vagina: Secondary | ICD-10-CM

## 2019-06-08 DIAGNOSIS — Z3200 Encounter for pregnancy test, result unknown: Secondary | ICD-10-CM | POA: Diagnosis present

## 2019-06-08 DIAGNOSIS — N926 Irregular menstruation, unspecified: Secondary | ICD-10-CM | POA: Insufficient documentation

## 2019-06-08 LAB — I-STAT BETA HCG BLOOD, ED (MC, WL, AP ONLY): I-stat hCG, quantitative: <5 m[IU]/mL (ref <5)

## 2019-06-08 NOTE — ED Triage Notes (Signed)
Pt reports 12 days late for her period, has taken 6 pregnancy tests that were all negative. Pt reports cramping across the pelvis that started yesterday and reports she has some discharge but no bleeding. Denies pain at this time. Reports feeling bloated, N/Vx1. Pt is worried she is having an ectopic pregnancy. LMP 04/18/19. Pt G2P1A1.

## 2019-06-09 LAB — URINALYSIS, ROUTINE W REFLEX MICROSCOPIC
Bilirubin Urine: NEGATIVE
Glucose, UA: NEGATIVE mg/dL
Ketones, ur: NEGATIVE mg/dL
Nitrite: NEGATIVE
Protein, ur: NEGATIVE mg/dL
Specific Gravity, Urine: 1.019 (ref 1.005–1.030)
pH: 6 (ref 5.0–8.0)

## 2019-06-09 LAB — WET PREP, GENITAL
Clue Cells Wet Prep HPF POC: NONE SEEN
Sperm: NONE SEEN
Trich, Wet Prep: NONE SEEN
Yeast Wet Prep HPF POC: NONE SEEN

## 2019-06-09 NOTE — ED Provider Notes (Signed)
[facility] EMERGENCY DEPARTMENT Provider Note   CSN: [id number] Arrival date & time: 06/08/19  2034     History   Chief Complaint Chief Complaint  Patient presents with  . Possible Pregnancy  . Gynecologic Exam    HPI Marilyn Thompson is a 28 y.o. female.     The history is provided by the patient and medical records.  Possible Pregnancy  Gynecologic Exam    28 year old female with no significant past medical history presenting to the ED with concern of possible pregnancy.  She reports her menstrual cycle is 12 days late which is irregular for her.  States she is not had any kind of spotting.  She did start noticing some discharge yesterday.  States she feels some lower abdominal cramping.  She was concerned she may have an ectopic pregnancy.  She has taken 6 home pregnancy test, all of which are negative.  She is married and monogamous with her husband.  Has not had annual PAP smear since birth of her daughter 3 years ago.  Past Medical History:  Diagnosis Date  . Medical history non-contributory     Patient Active Problem List   Diagnosis Date Noted  . Pregnancy 03/10/2016    Past Surgical History:  Procedure Laterality Date  . TONSILLECTOMY  1994     OB History    Gravida  2   Para  1   Term  1   Preterm  0   AB  1   Living  1     SAB      TAB  1   Ectopic      Multiple  0   Live Births  1            Home Medications    Prior to Admission medications   Medication Sig Start Date End Date Taking? Authorizing Provider  cephALEXin (KEFLEX) 500 MG capsule Take 1 capsule (500 mg total) by mouth 4 (four) times daily. 02/09/18   Lily Kocher, PA-C  doxycycline (VIBRAMYCIN) 100 MG capsule Take 1 capsule (100 mg total) by mouth 2 (two) times daily. 02/09/18   Lily Kocher, PA-C  ibuprofen (ADVIL,MOTRIN) 600 MG tablet Take 1 tablet (600 mg total) by mouth 4 (four) times daily. 02/09/18   Lily Kocher, PA-C  pantoprazole  (PROTONIX) 40 MG tablet Take 40 mg by mouth daily.    [provider]  Prenatal Vit-Fe Fumarate-FA (PRENATAL MULTIVITAMIN) TABS tablet Take 1 tablet by mouth daily at 12 noon.    [provider]    Family History Family History  Problem Relation Age of Onset  . Cancer Paternal Grandmother   . Diabetes Paternal Grandmother     Social History Social History   Tobacco Use  . Smoking status: Current Every Day Smoker    Packs/day: 0.50    Years: 9.00    Pack years: 4.50    Types: Cigarettes  . Smokeless tobacco: Never Used  Substance Use Topics  . Alcohol use: Yes    Comment: occ  . Drug use: No     Allergies   Demerol [meperidine]   Review of Systems Review of Systems  Genitourinary: Positive for menstrual problem and vaginal discharge.  All other systems reviewed and are negative.    Physical Exam Updated Vital Signs BP 124/65   Pulse 62   Temp 98.3 F (36.8 C) (Oral)   Resp 19   Ht 5\' 4"  (1.626 m)   Wt 82.6 kg  LMP 04/18/2019   SpO2 100%   BMI 31.24 kg/m   Physical Exam Vitals signs and nursing note reviewed.  Constitutional:      Appearance: She is well-developed.  HENT:     Head: Normocephalic and atraumatic.  Eyes:     Conjunctiva/sclera: Conjunctivae normal.     Pupils: Pupils are equal, round, and reactive to light.  Neck:     Musculoskeletal: Normal range of motion.  Cardiovascular:     Rate and Rhythm: Normal rate and regular rhythm.     Heart sounds: Normal heart sounds.  Pulmonary:     Effort: Pulmonary effort is normal.     Breath sounds: Normal breath sounds.  Abdominal:     General: Bowel sounds are normal.     Palpations: Abdomen is soft.  Genitourinary:    Comments: Exam chaperoned by RN Normal female external genitalia without visible lesion/rash; moderate amount of thick, brown/yellow discharge, also has some mucous noted; foul odor, no adnexal or CMT Musculoskeletal: Normal range of motion.  Skin:     General: Skin is warm and dry.  Neurological:     Mental Status: She is alert and oriented to person, place, and time.      ED Treatments / Results  Labs (all labs ordered are listed, but only abnormal results are displayed) Labs Reviewed  WET PREP, GENITAL - Abnormal; Notable for the following components:      Result Value   WBC, Wet Prep HPF POC MANY (*)    All other components within normal limits  URINALYSIS, ROUTINE W REFLEX MICROSCOPIC - Abnormal; Notable for the following components:   APPearance HAZY (*)    Hgb urine dipstick LARGE (*)    Leukocytes,Ua SMALL (*)    Bacteria, UA RARE (*)    All other components within normal limits  I-STAT BETA HCG BLOOD, ED ([facility], [facility], [facility] ONLY)  GC/CHLAMYDIA PROBE AMP ([place]) NOT AT [facility]    EKG None  Radiology No results found.  Procedures Procedures (including critical care time)  Medications Ordered in ED Medications - No data to display   Initial Impression / Assessment and Plan / ED Course  I have reviewed the triage vital signs and the nursing notes.  Pertinent labs & imaging results that were available during my care of the patient were reviewed by me and considered in my medical decision making (see chart for details).  28 year old female presenting to the ED due to menstrual cycle that is 12 days late.  She has had 6 - home pregnancy test.  She was concerned for possible ectopic.  Her hCG here is normal so I feel this is less likely.  Pelvic exam was performed and she does have some brown/yellow discharge with mucus but no adnexal or cervical motion tenderness.  She does not have any expressed concern for STD.  Wet prep is normal aside from WBCs.  Gc/chl pending.  UA does have noted blood but suspect this is vaginal contaminant., likely early blood of menses given negative wet prep.  I recommended that she follow-up with OB/GYN, she is due for PAP smear anyhow as she has not had one in 3 years.  She may return here for  any new/acute changes.  Final Clinical Impressions(s) / ED Diagnoses   Final diagnoses:  Menstrual irregularity  Vaginal discharge    ED Discharge Orders    None       Garlon Hatchet, PA-C 06/09/19 0455    Zadie Rhine,  MD 06/09/19 [id number]

## 2019-06-09 NOTE — Discharge Instructions (Addendum)
Follow-up with GYN-- probably a good idea to have a regular exam with PAP but would discuss any ongoing menstrual issues with them as well. Return here for any new/acute changes.

## 2019-06-09 NOTE — ED Notes (Signed)
Pt verbalized understanding of d/c instructions and follow up care. Pt had no additional questions.

## 2019-06-12 LAB — GC/CHLAMYDIA PROBE AMP (~~LOC~~) NOT AT ARMC
Chlamydia: NEGATIVE
Neisseria Gonorrhea: NEGATIVE

## 2020-12-27 ENCOUNTER — Encounter (HOSPITAL_COMMUNITY): Payer: Self-pay | Admitting: Emergency Medicine

## 2020-12-27 ENCOUNTER — Other Ambulatory Visit: Payer: Self-pay

## 2020-12-27 ENCOUNTER — Emergency Department (HOSPITAL_COMMUNITY): Payer: Medicaid Other

## 2020-12-27 ENCOUNTER — Emergency Department (HOSPITAL_COMMUNITY)
Admission: EM | Admit: 2020-12-27 | Discharge: 2020-12-27 | Disposition: A | Discharge status: Home / Self Care | Payer: Medicaid Other | Attending: Emergency Medicine | Admitting: Emergency Medicine

## 2020-12-27 DIAGNOSIS — F17210 Nicotine dependence, cigarettes, uncomplicated: Secondary | ICD-10-CM | POA: Diagnosis not present

## 2020-12-27 DIAGNOSIS — S40262A Insect bite (nonvenomous) of left shoulder, initial encounter: Secondary | ICD-10-CM | POA: Diagnosis not present

## 2020-12-27 DIAGNOSIS — R42 Dizziness and giddiness: Secondary | ICD-10-CM | POA: Diagnosis not present

## 2020-12-27 DIAGNOSIS — U071 COVID-19: Secondary | ICD-10-CM | POA: Diagnosis not present

## 2020-12-27 DIAGNOSIS — S40261A Insect bite (nonvenomous) of right shoulder, initial encounter: Secondary | ICD-10-CM | POA: Insufficient documentation

## 2020-12-27 DIAGNOSIS — W57XXXA Bitten or stung by nonvenomous insect and other nonvenomous arthropods, initial encounter: Secondary | ICD-10-CM | POA: Insufficient documentation

## 2020-12-27 LAB — CBC WITH DIFFERENTIAL/PLATELET
Abs Immature Granulocytes: 0 10*3/uL (ref 0.00–0.07)
Basophils Absolute: 0 10*3/uL (ref 0.0–0.1)
Basophils Relative: 0 %
Eosinophils Absolute: 0 10*3/uL (ref 0.0–0.5)
Eosinophils Relative: 0 %
HCT: 47 % — ABNORMAL HIGH (ref 36.0–46.0)
Hemoglobin: 16.1 g/dL — ABNORMAL HIGH (ref 12.0–15.0)
Lymphocytes Relative: 48 %
Lymphs Abs: 1.1 10*3/uL (ref 0.7–4.0)
MCH: 30.4 pg (ref 26.0–34.0)
MCHC: 34.3 g/dL (ref 30.0–36.0)
MCV: 88.8 fL (ref 80.0–100.0)
Monocytes Absolute: 0.4 10*3/uL (ref 0.1–1.0)
Monocytes Relative: 16 %
Neutro Abs: 0.8 10*3/uL — ABNORMAL LOW (ref 1.7–7.7)
Neutrophils Relative %: 36 %
Platelets: 169 10*3/uL (ref 150–400)
RBC: 5.29 MIL/uL — ABNORMAL HIGH (ref 3.87–5.11)
RDW: 12 % (ref 11.5–15.5)
WBC: 2.2 10*3/uL — ABNORMAL LOW (ref 4.0–10.5)
nRBC: 0 % (ref 0.0–0.2)

## 2020-12-27 LAB — COMPREHENSIVE METABOLIC PANEL WITH GFR
ALT: 45 U/L — ABNORMAL HIGH (ref 0–44)
AST: 43 U/L — ABNORMAL HIGH (ref 15–41)
Albumin: 4.1 g/dL (ref 3.5–5.0)
Alkaline Phosphatase: 54 U/L (ref 38–126)
Anion gap: 11 (ref 5–15)
BUN: 14 mg/dL (ref 6–20)
CO2: 21 mmol/L — ABNORMAL LOW (ref 22–32)
Calcium: 9.3 mg/dL (ref 8.9–10.3)
Chloride: 105 mmol/L (ref 98–111)
Creatinine, Ser: 0.98 mg/dL (ref 0.44–1.00)
GFR, Estimated: >60 mL/min
Glucose, Bld: 93 mg/dL (ref 70–99)
Potassium: 4.1 mmol/L (ref 3.5–5.1)
Sodium: 137 mmol/L (ref 135–145)
Total Bilirubin: 0.8 mg/dL (ref 0.3–1.2)
Total Protein: 7 g/dL (ref 6.5–8.1)

## 2020-12-27 LAB — RESP PANEL BY RT-PCR (FLU A&B, COVID) ARPGX2
Influenza A by PCR: NEGATIVE
Influenza B by PCR: NEGATIVE
SARS Coronavirus 2 by RT PCR: POSITIVE — AB

## 2020-12-27 LAB — I-STAT BETA HCG BLOOD, ED (MC, WL, AP ONLY): I-stat hCG, quantitative: <5 m[IU]/mL (ref <5)

## 2020-12-27 IMAGING — MR MR MRA HEAD W/O CM
1 series · 20 of 48 positions shown · non-contrast
Comparison: None.

CLINICAL DATA: Dizziness.

EXAM:
MRA HEAD WITHOUT CONTRAST
MRA NECK WITHOUT CONTRAST
TECHNIQUE: Angiographic images of the Circle of Willis were obtained using MRA
technique without intravenous contrast. Angiographic images of the
neck were obtained using MRA technique without intravenous contrast.
Carotid stenosis measurements (when applicable) are obtained
utilizing NASCET criteria, using the distal internal carotid
diameter as the denominator.

[Series 3: ax (id) · axial · 1.0mm · 0.43mm/px · z∈[-69,+14]mm · 20 of 176 slices shown]
[im 1/176]
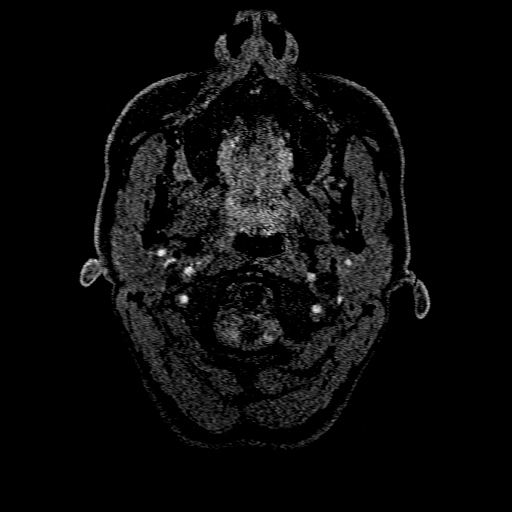
[im 4/176]
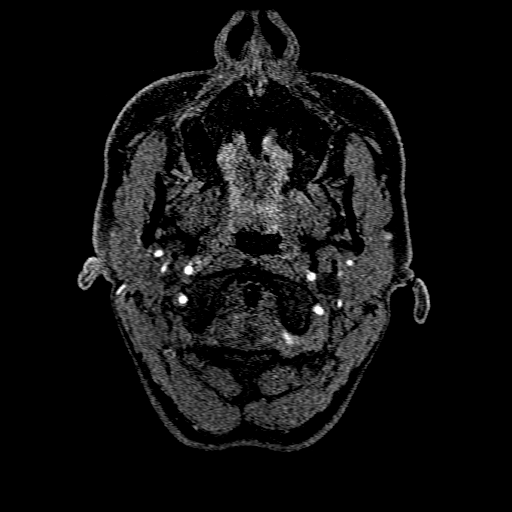
[im 8/176]
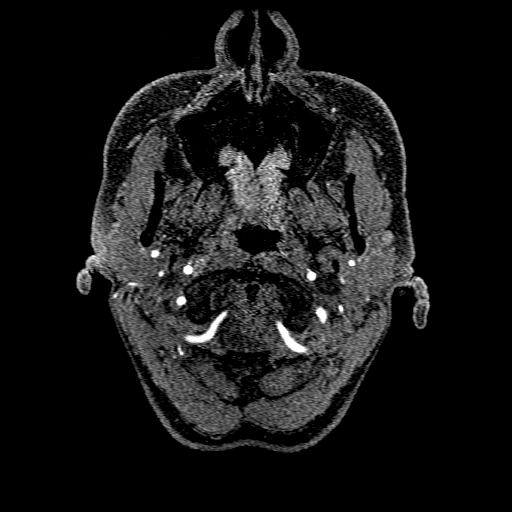
[im 12/176]
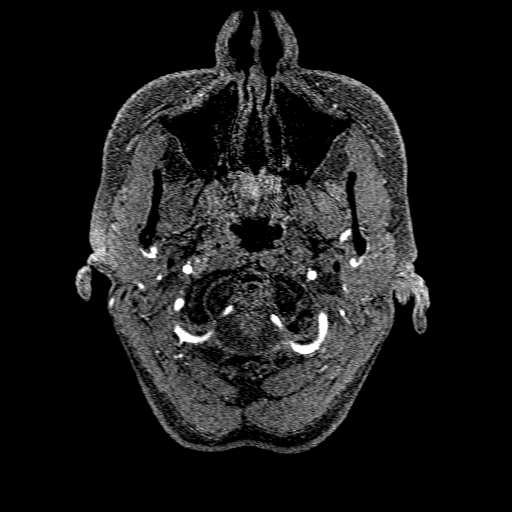
[im 15/176]
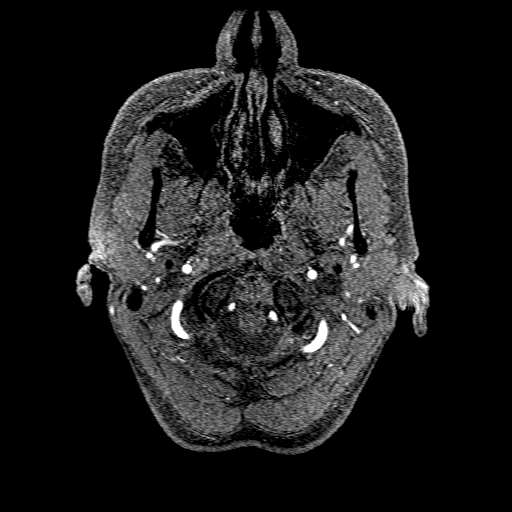
[im 19/176]
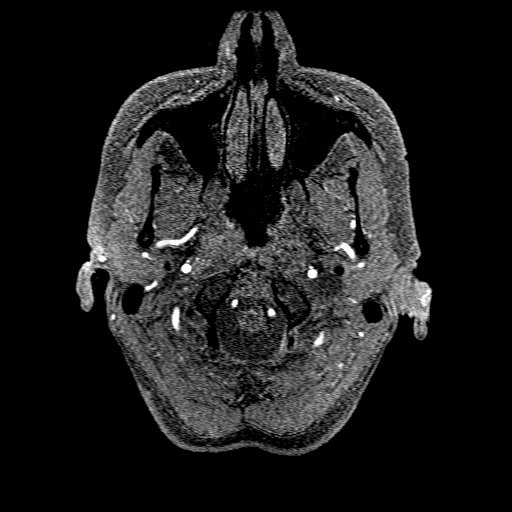
[im 23/176]
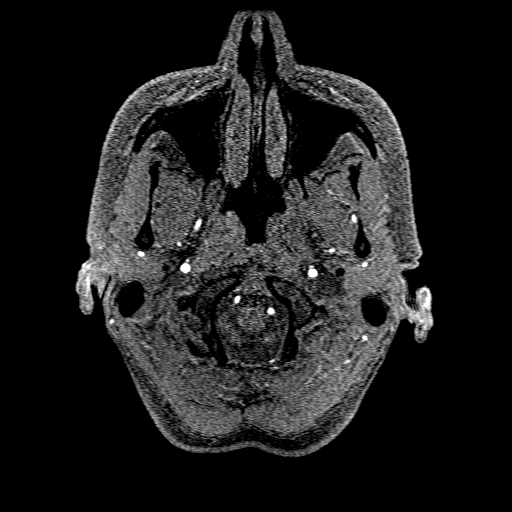
[im 27/176]
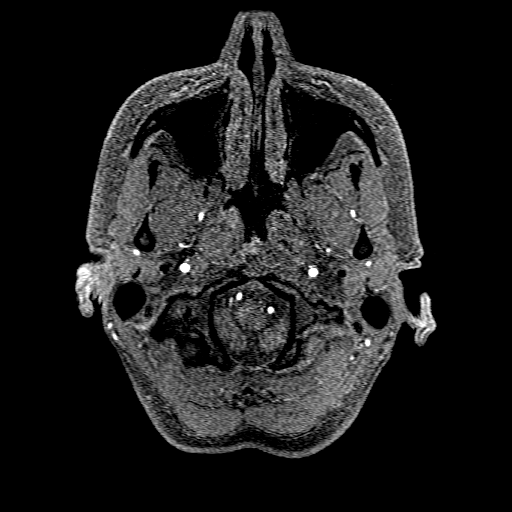
[im 30/176]
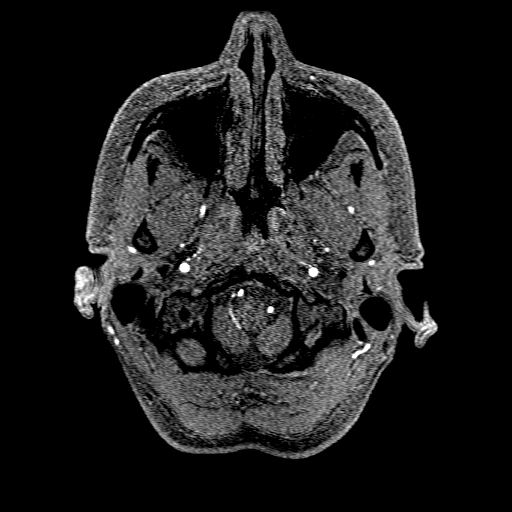
[im 34/176]
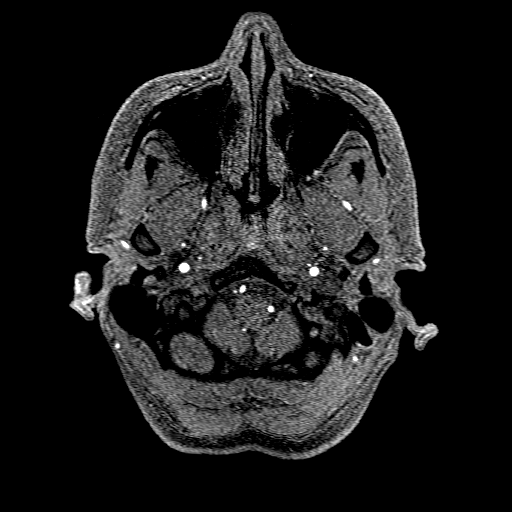
[im 38/176]
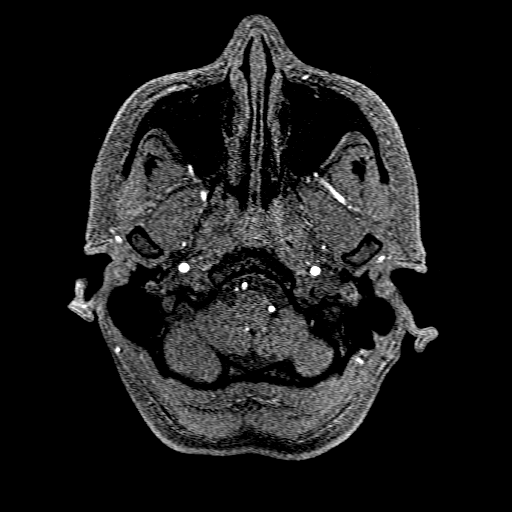
[im 41/176]
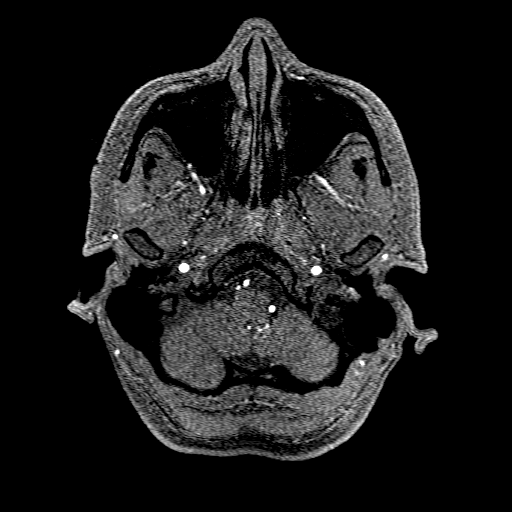
[im 56/176]
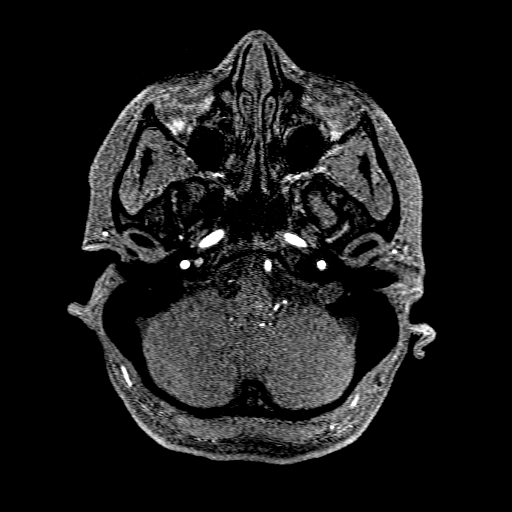
[im 79/176]
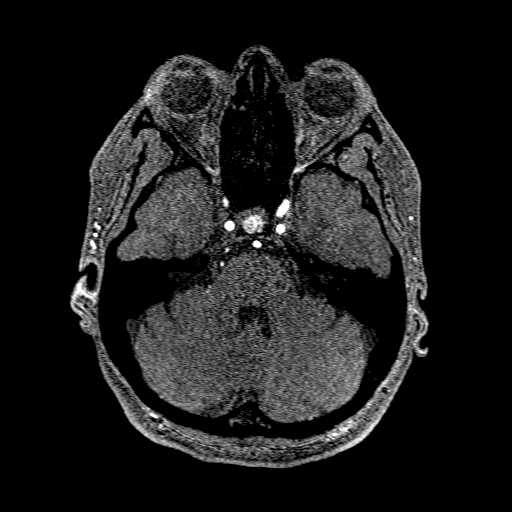
[im 90/176]
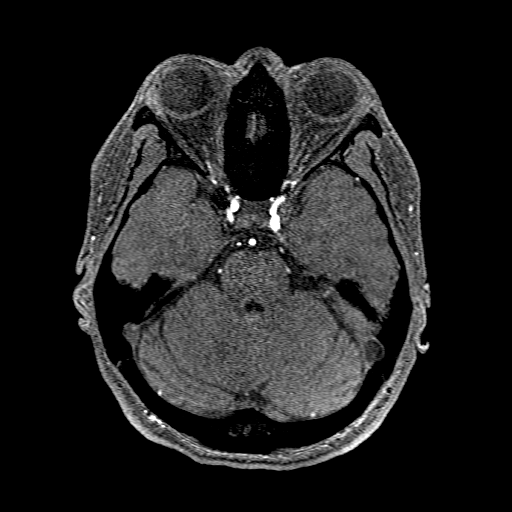
[im 101/176]
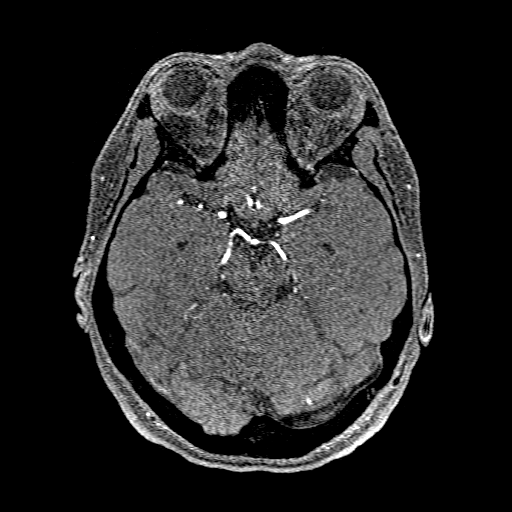
[im 123/176]
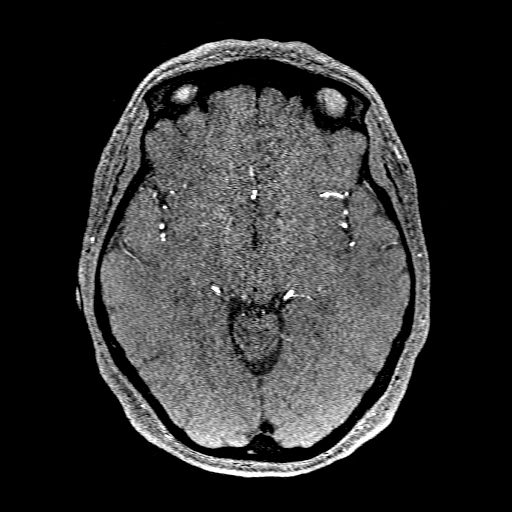
[im 146/176]
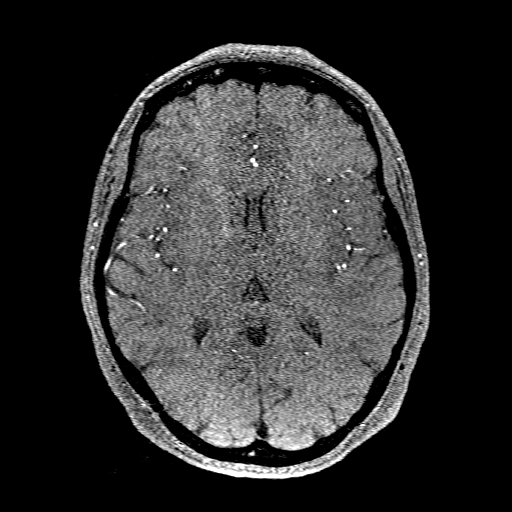
[im 149/176]
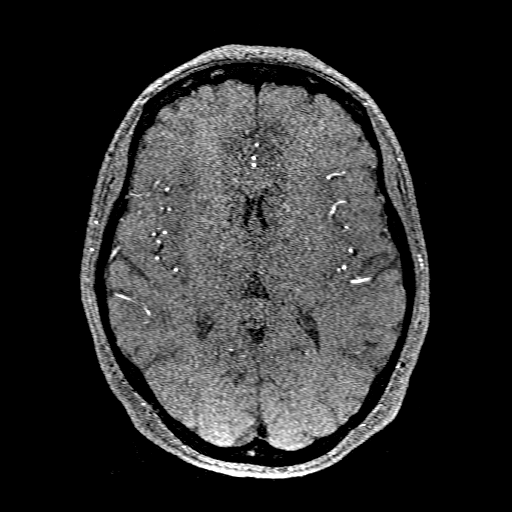
[im 168/176]
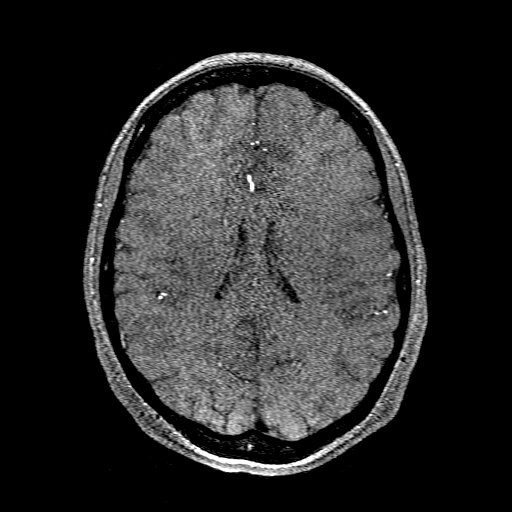

[20 of 48 positions shown; findings below may reference images not displayed]

FINDINGS: MRA HEAD FINDINGS

Anterior circulation: No large vessel occlusion or proximal
hemodynamically significant stenosis. No aneurysm.

Posterior circulation: No large vessel occlusion or proximal
hemodynamically significant stenosis. No aneurysm.

MRA NECK FINDINGS

Aorta: Great vessel origins are patent.

Carotid arteries: No significant (greater than 50%) stenosis.

Vertebral arteries: Codominant. No significant (greater than 50%)
stenosis.
IMPRESSION: No large vessel occlusion or hemodynamically significant proximal
stenosis in the head or neck.

## 2020-12-27 IMAGING — MR MR HEAD W/O CM
7 of 11 series · 25 of 48 positions shown · non-contrast
Comparison: None.

CLINICAL DATA: Dizziness.

EXAM:
MRI HEAD WITHOUT CONTRAST
TECHNIQUE: Multiplanar, multiecho pulse sequences of the brain and surrounding
structures were obtained without intravenous contrast.

[Series 2: DWI · axial · 3.0mm · 0.94mm/px · z∈[-74,+69]mm · 8 of 97 slices shown (1 of 2)]
[im 1/97]
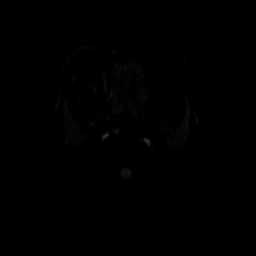
[im 14/97]
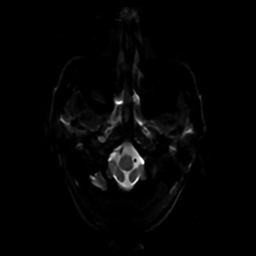
[im 28/97]
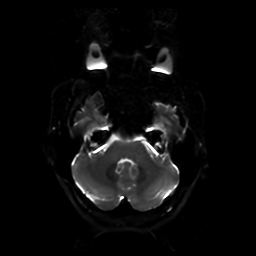
[im 42/97]
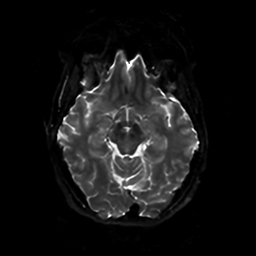
[im 55/97]
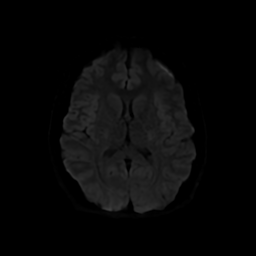
[im 69/97]
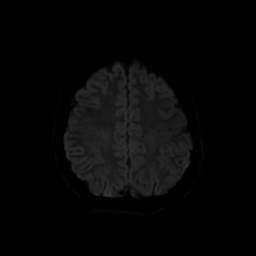
[im 83/97]
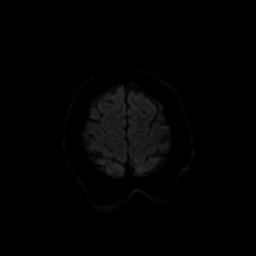
[im 97/97]
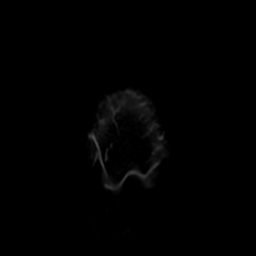

[Series 7: DWI · coronal · 4.0mm · 0.94mm/px · 5 of 67 slices shown (2 of 2)]
[im 1/67]
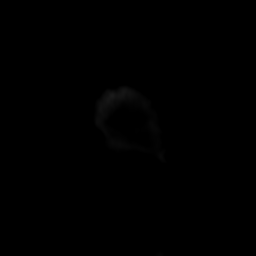
[im 17/67]
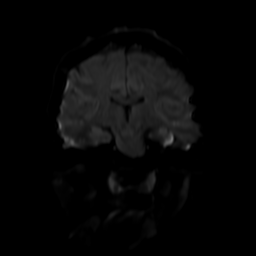
[im 34/67]
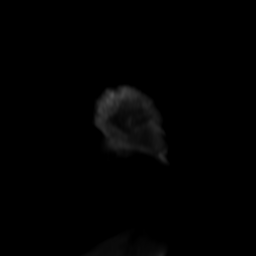
[im 50/67]
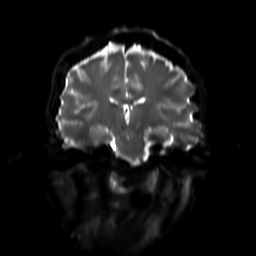
[im 67/67]
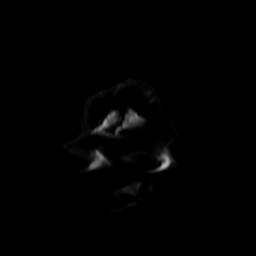

[Series 8: FLAIR · sagittal · 5.0mm · 0.23mm/px · 2 of 23 slices shown (1 of 2)]
[im 1/23]
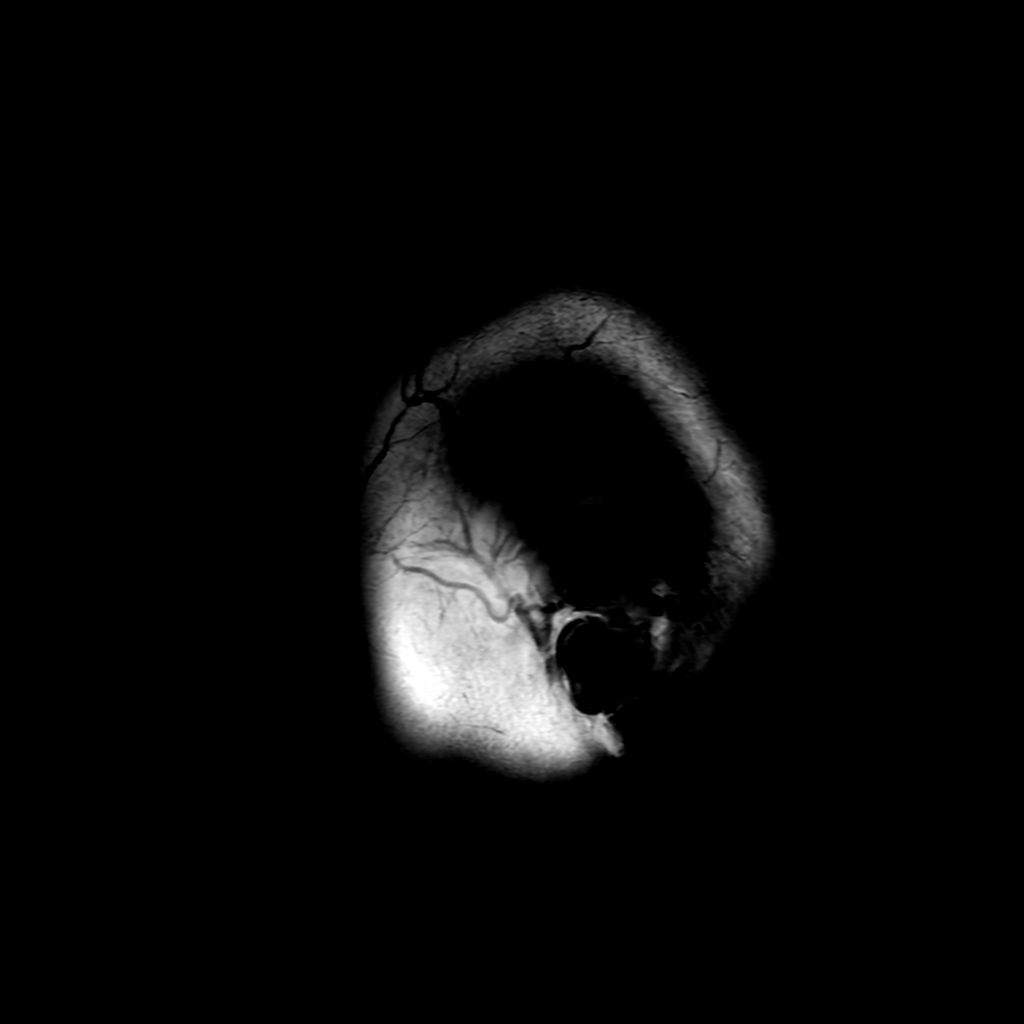
[im 23/23]
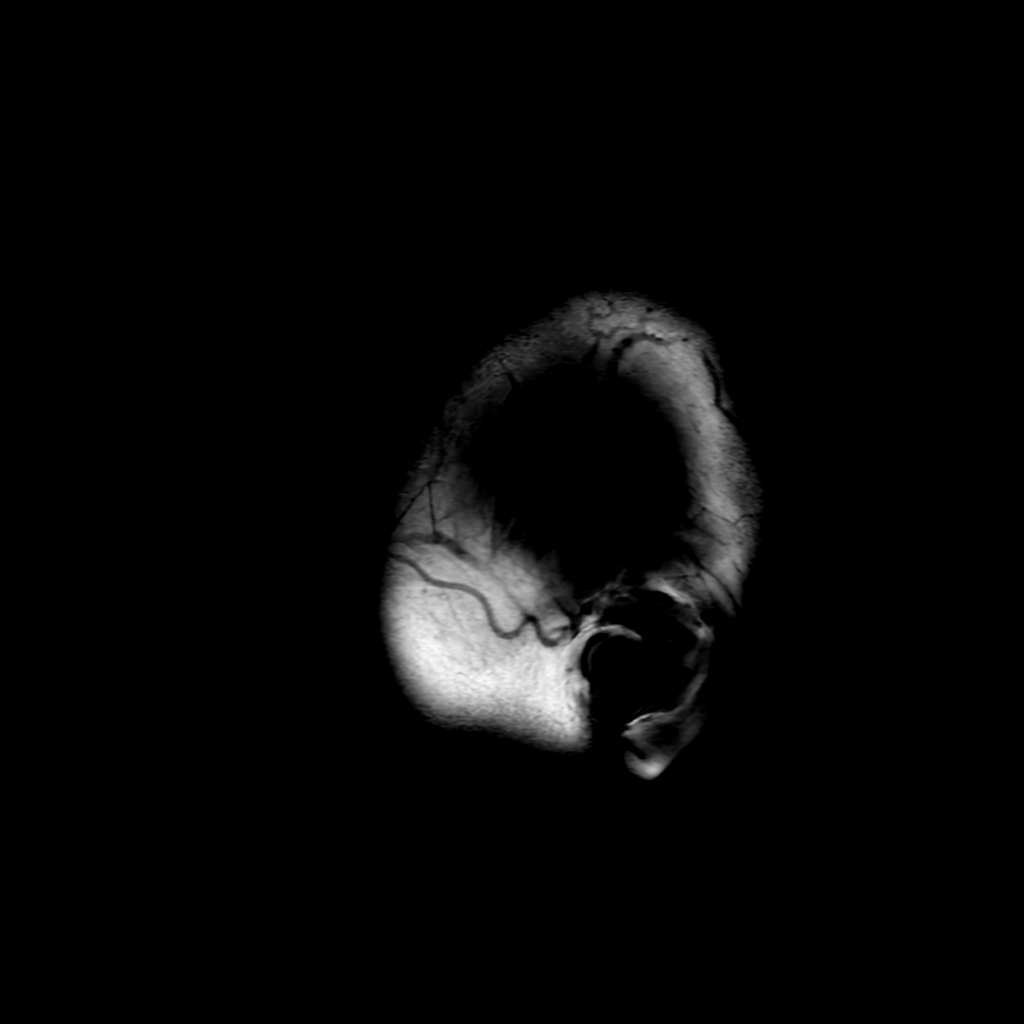

[Series 9: T2 · axial · 5.0mm · 0.23mm/px · 1 of 25 slices shown]
[im 1/25]
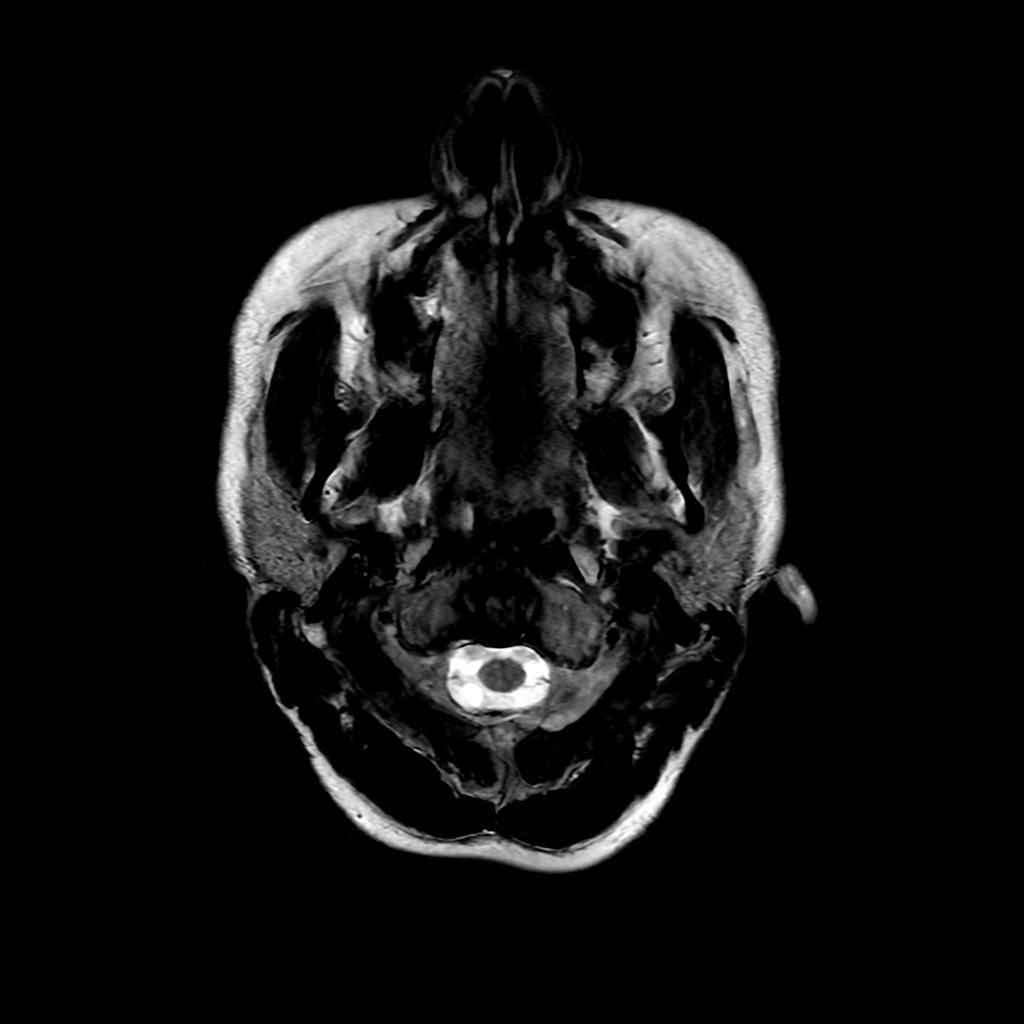

[Series 10: FLAIR · axial · 3.0mm · 0.45mm/px · z∈[-75,+68]mm · 2 of 25 slices shown (2 of 2)]
[im 1/25]
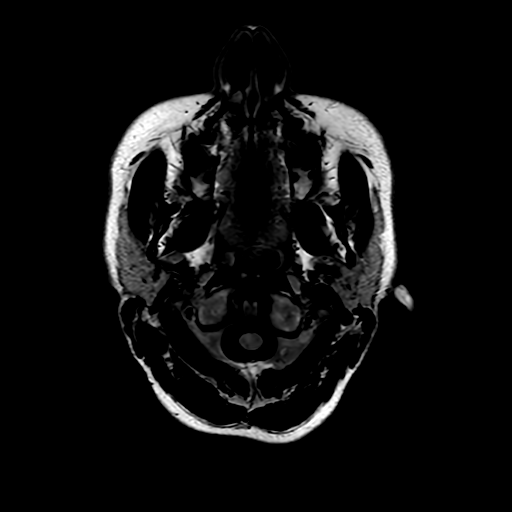
[im 25/25]
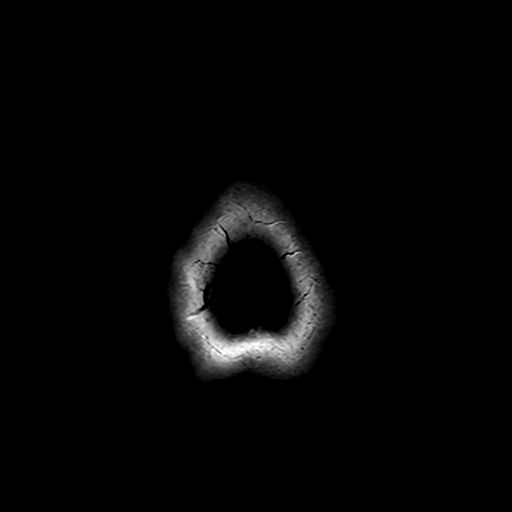

[Series 250: ADC · axial · 3.0mm · 0.94mm/px · z∈[-77,+69]mm · 4 of 50 slices shown (1 of 2)]
[im 1/50]
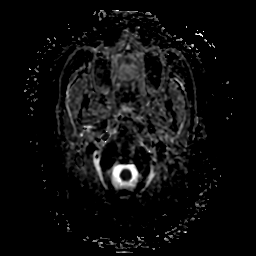
[im 17/50]
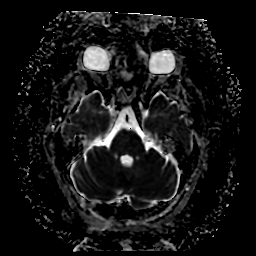
[im 33/50]
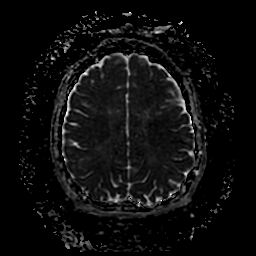
[im 50/50]
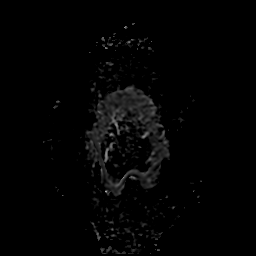

[Series 750: ADC · coronal · 4.0mm · 0.94mm/px · 3 of 35 slices shown (2 of 2)]
[im 1/35]
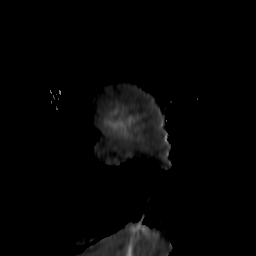
[im 18/35]
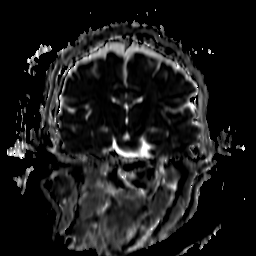
[im 35/35]
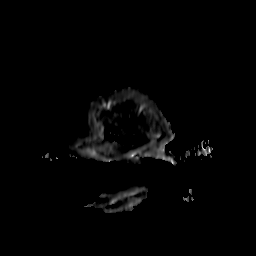

[25 of 48 positions shown; findings below may reference images not displayed]

FINDINGS: Brain: No acute infarction, hemorrhage, hydrocephalus, extra-axial
collection or mass lesion.

Vascular: Further evaluated on concurrent MRA.

Skull and upper cervical spine: Normal marrow signal.

Sinuses/Orbits: Clear sinuses.  Unremarkable orbits.

Other: No sizable mastoid effusions.
IMPRESSION: No evidence of acute intracranial abnormality.

## 2020-12-27 IMAGING — MR MR MRA NECK W/O CM
2 of 3 series · 19 of 48 positions shown · non-contrast
Comparison: None.

CLINICAL DATA: Dizziness.

EXAM:
MRA HEAD WITHOUT CONTRAST
MRA NECK WITHOUT CONTRAST
TECHNIQUE: Angiographic images of the Circle of Willis were obtained using MRA
technique without intravenous contrast. Angiographic images of the
neck were obtained using MRA technique without intravenous contrast.
Carotid stenosis measurements (when applicable) are obtained
utilizing NASCET criteria, using the distal internal carotid
diameter as the denominator.

[Series 4: (id) loc ssfse · axial · 5.0mm · 0.59mm/px · 1 of 15 slices shown]
[im 1/15]
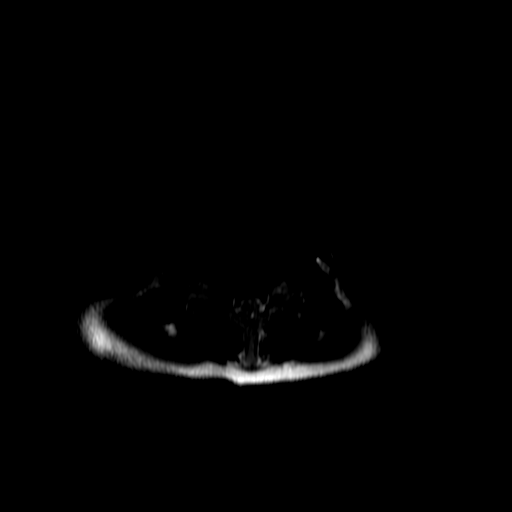

[Series 6: sag inhance (id) · sagittal · 1.2mm · 0.47mm/px · 18 of 353 slices shown]
[im 1/353]
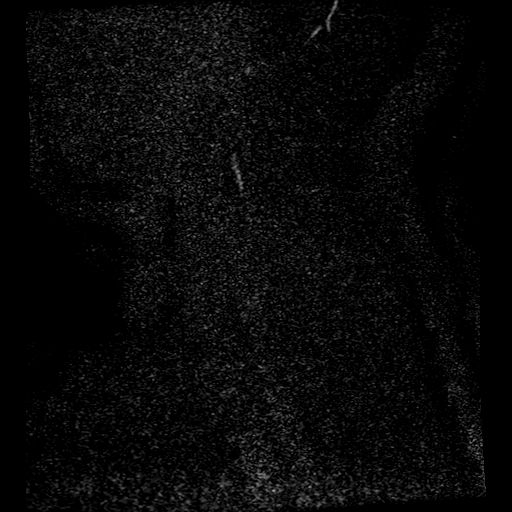
[im 12/353]
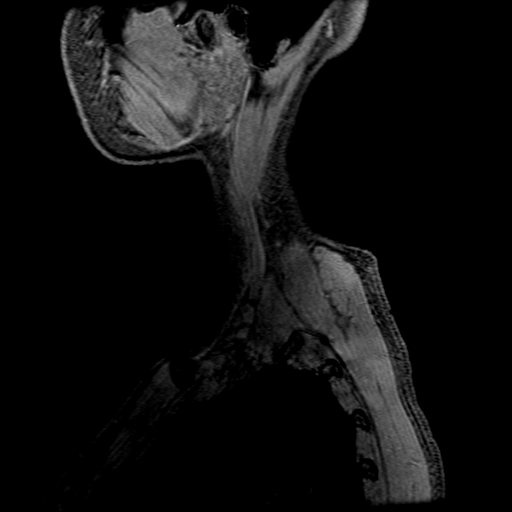
[im 23/353]
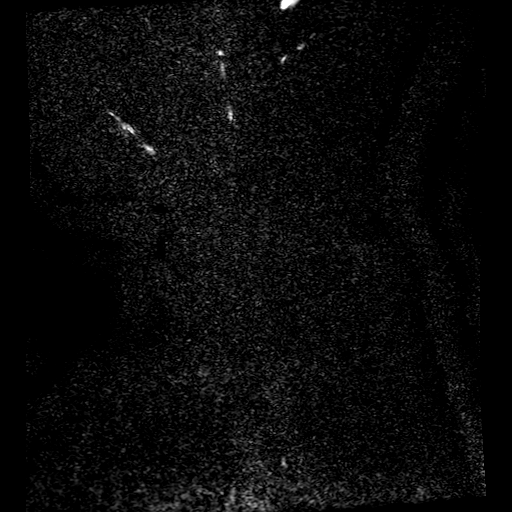
[im 34/353]
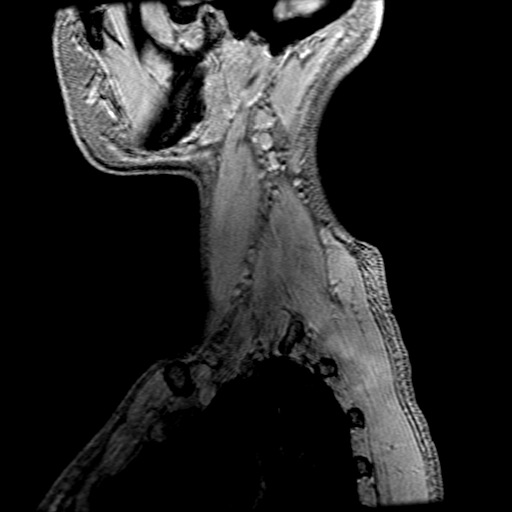
[im 45/353]
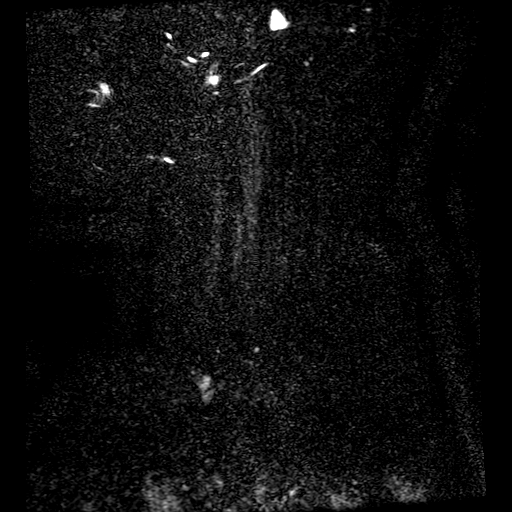
[im 56/353]
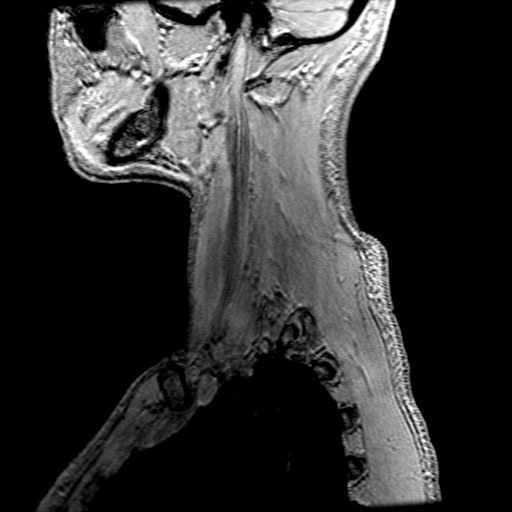
[im 67/353]
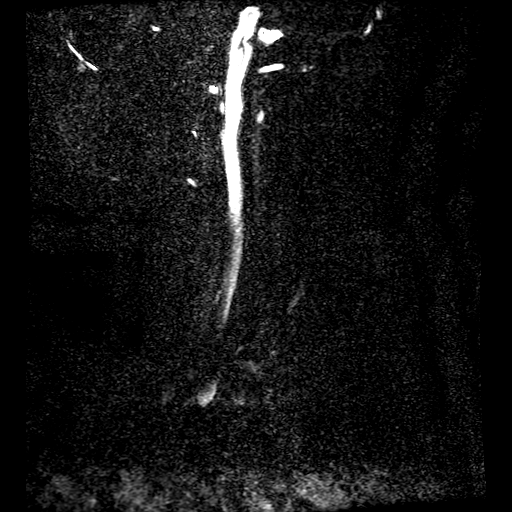
[im 78/353]
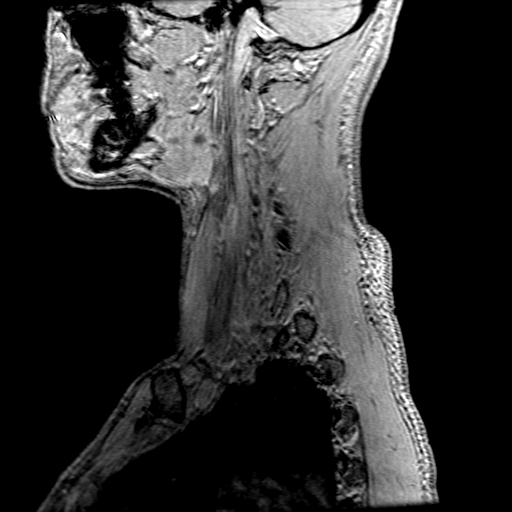
[im 89/353]
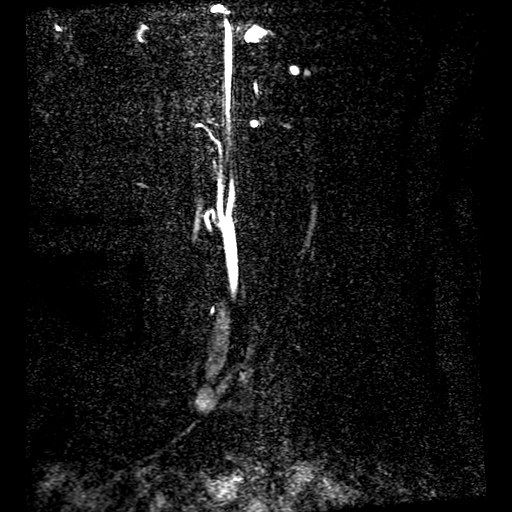
[im 100/353]
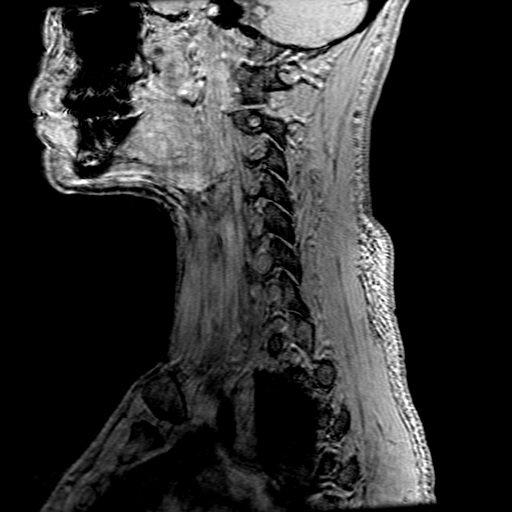
[im 111/353]
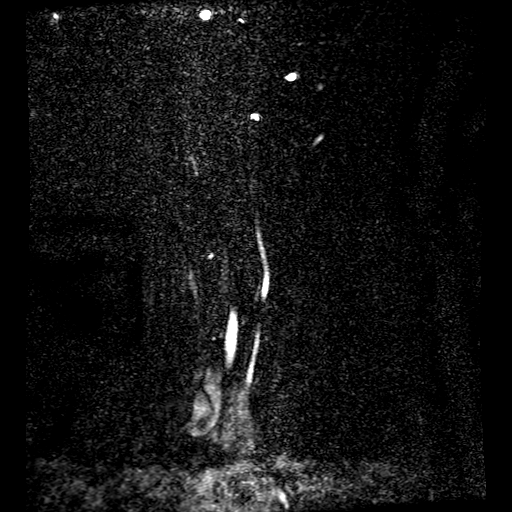
[im 155/353]
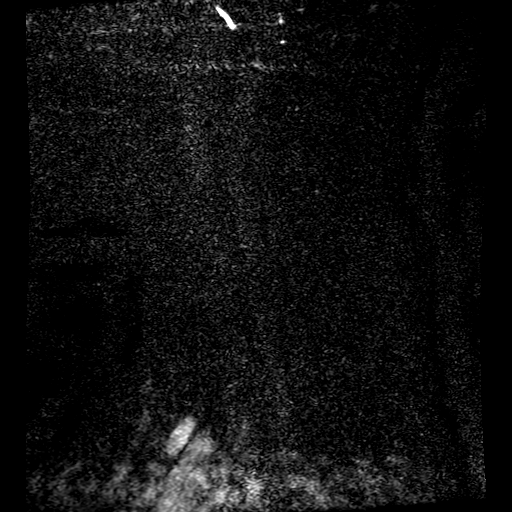
[im 177/353]
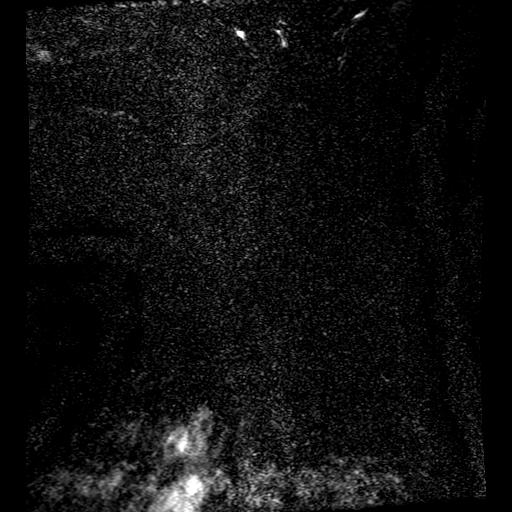
[im 199/353]
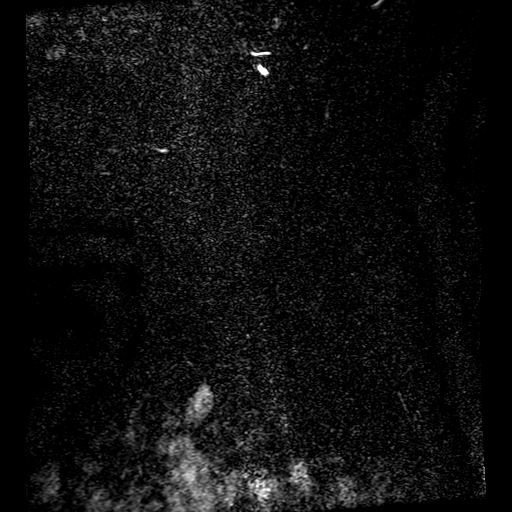
[im 243/353]
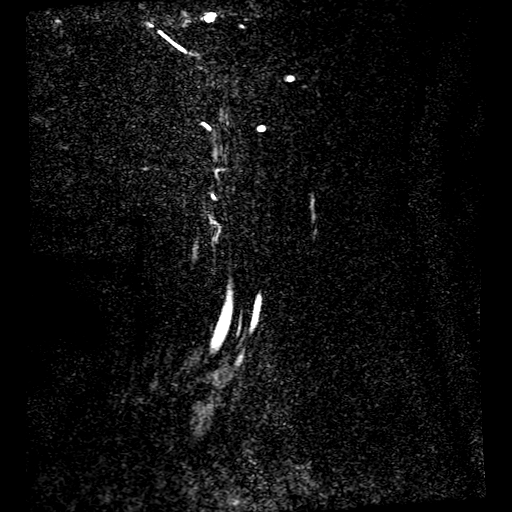
[im 287/353]
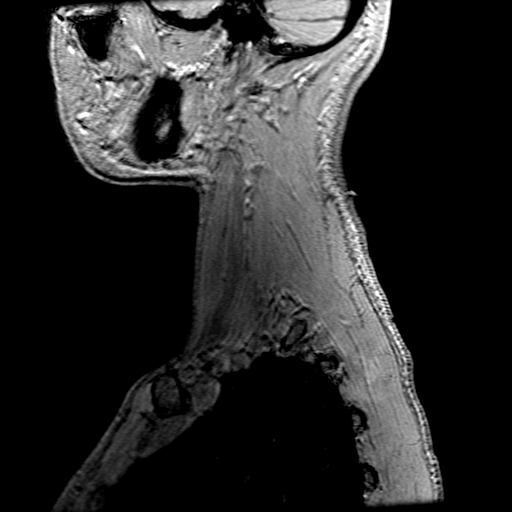
[im 298/353]
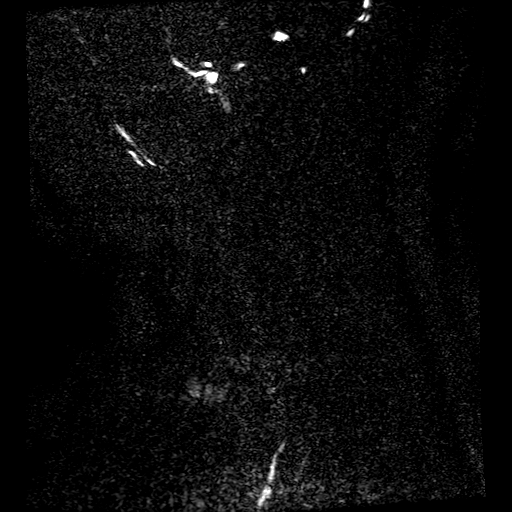
[im 331/353]
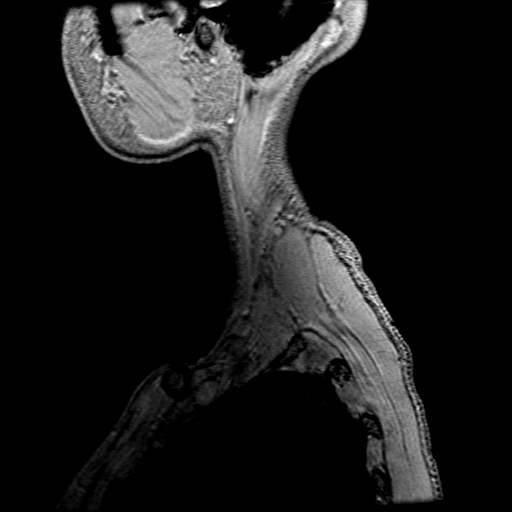

[19 of 48 positions shown; findings below may reference images not displayed]

FINDINGS: MRA HEAD FINDINGS

Anterior circulation: No large vessel occlusion or proximal
hemodynamically significant stenosis. No aneurysm.

Posterior circulation: No large vessel occlusion or proximal
hemodynamically significant stenosis. No aneurysm.

MRA NECK FINDINGS

Aorta: Great vessel origins are patent.

Carotid arteries: No significant (greater than 50%) stenosis.

Vertebral arteries: Codominant. No significant (greater than 50%)
stenosis.
IMPRESSION: No large vessel occlusion or hemodynamically significant proximal
stenosis in the head or neck.

## 2020-12-27 MED ORDER — DOXYCYCLINE HYCLATE 100 MG PO CAPS: 100.0000 mg | ORAL_CAPSULE | Freq: Two times a day (BID) | ORAL | 0 refills | Status: AC

## 2020-12-27 NOTE — Discharge Instructions (Signed)
You positive for coronavirus.  You were greater than 5 days and do not require isolation but should mask in public for at least 10 days from the onset of your symptoms.  You may still have something going on with your tick bite and need to follow-up with infectious disease.  I am discharging you with doxycycline.  Make sure that you aggressively use and screen when you go outside as this can increase your risk for getting a sunburn.  You can use over-the-counter medications like Tylenol and Motrin as well as cold medicines like NyQuil or DayQuil for symptoms.  Return for any new or worsening symptoms

## 2020-12-27 NOTE — ED Provider Notes (Signed)
Emergency Medicine Provider Triage Evaluation Note  Marilyn Thompson , a 30 y.o. female  was evaluated in triage.  Pt complains of fever.  Review of Systems  Positive: Tick bite, fever, body aches, nausea, vomiting, cough Negative: Neck stiffness, sob, dysuria  Physical Exam  BP 132/88   Pulse 75   Temp 99 F (37.2 C)   Resp 18   LMP 12/17/2020   SpO2 100%  Gen:   Awake, no distress   Resp:  Normal effort  MSK:   Moves extremities without difficulty  Other:  Tick bite to L lateral chest wall, no concerning rash, no nuchal rigidity  Medical Decision Making  Medically screening exam initiated at 10:48 AM.  Appropriate orders placed.  Marilyn Thompson was informed that the remainder of the evaluation will be completed by another provider, this initial triage assessment does not replace that evaluation, and the importance of remaining in the ED until their evaluation is complete.  Tick bite to L lateral chest wall on May 13th. Developed fever, chills, headache, body aches, cough, and nausea/vomiting 4 days ago.  Have not been vaccinated for covid.     Marilyn Helper, PA-C 12/27/20 1050    Marilyn Thompson, Marilyn Seal, DO 12/27/20 1526

## 2020-12-27 NOTE — ED Triage Notes (Signed)
Pt reports removing a tick 2 weeks ago.  Reports generalized body aches, headache, muscle stiffness, neck pain, and swelling to lymph nodes since Tuesday.  Denies fever.

## 2020-12-27 NOTE — ED Provider Notes (Signed)
[facility] EMERGENCY DEPARTMENT Provider Note   CSN: [id number] Arrival date & time: 12/27/20  1031     History No chief complaint on file.   Marilyn Thompson is a 30 y.o. female who presents emergency department for flulike symptoms after tick bite.  The patient states that Marilyn found a tick embedded in her left scapular region on May 13.  This past week Marilyn began having symptoms of body aches, chills, neck pain, headache.  Marilyn states that Marilyn was having right-sided neck ear pain which Marilyn described as sharp.  2 days ago Marilyn had an episode where Marilyn felt something pop in her temple.  Her neck and ear pain went away but Marilyn became immediately unstable on her feet with dizziness and fell forward and had to hold herself up on her bar for approximately 10 minutes before her symptoms resolved.  Marilyn has not had any of those symptoms since but is concerned because Marilyn has a family history of death by brain aneurysm (maternal grandmother in parentheses.)  Patient also noticed lymphadenopathy on the left side of her neck.  Marilyn has slight rash to the back of her neck.  No palmar or plantar rash.  No nausea or vomiting.  HPI     Past Medical History:  Diagnosis Date  . Medical history non-contributory     Patient Active Problem List   Diagnosis Date Noted  . Pregnancy 03/10/2016    Past Surgical History:  Procedure Laterality Date  . TONSILLECTOMY  1994     OB History    Gravida  2   Para  1   Term  1   Preterm  0   AB  1   Living  1     SAB      IAB  1   Ectopic      Multiple  0   Live Births  1           Family History  Problem Relation Age of Onset  . Cancer Paternal Grandmother   . Diabetes Paternal Grandmother     Social History   Tobacco Use  . Smoking status: Current Every Day Smoker    Packs/day: 0.50    Years: 9.00    Pack years: 4.50    Types: Cigarettes  . Smokeless tobacco: Never Used  Substance Use Topics  . Alcohol  use: Yes    Comment: occ  . Drug use: Yes    Types: Marijuana    Home Medications Prior to Admission medications   Medication Sig Start Date End Date Taking? Authorizing Provider  cephALEXin (KEFLEX) 500 MG capsule Take 1 capsule (500 mg total) by mouth 4 (four) times daily. Patient not taking: Reported on 12/27/2020 02/09/18   Ivery Quale, PA-C  doxycycline (VIBRAMYCIN) 100 MG capsule Take 1 capsule (100 mg total) by mouth 2 (two) times daily. Patient not taking: Reported on 12/27/2020 02/09/18   Ivery Quale, PA-C  ibuprofen (ADVIL,MOTRIN) 600 MG tablet Take 1 tablet (600 mg total) by mouth 4 (four) times daily. Patient not taking: No sig reported 02/09/18   Ivery Quale, PA-C    Allergies    Demerol [meperidine]  Review of Systems   Review of Systems Ten systems reviewed and are negative for acute change, except as noted in the HPI.   Physical Exam Updated Vital Signs BP 97/60   Pulse (!) 52   Temp 99 F (37.2 C)   Resp 18   LMP 12/17/2020  SpO2 99%   Physical Exam Vitals and nursing note reviewed.  Constitutional:      General: Marilyn is not in acute distress.    Appearance: Marilyn is well-developed. Marilyn is not diaphoretic.  HENT:     Head: Normocephalic and atraumatic.     Right Ear: Tympanic membrane normal.     Left Ear: Tympanic membrane normal.  Eyes:     General: No scleral icterus.    Conjunctiva/sclera: Conjunctivae normal.  Cardiovascular:     Rate and Rhythm: Normal rate and regular rhythm.     Heart sounds: Normal heart sounds. No murmur heard. No friction rub. No gallop.   Pulmonary:     Effort: Pulmonary effort is normal. No respiratory distress.     Breath sounds: Normal breath sounds.  Abdominal:     General: Bowel sounds are normal. There is no distension.     Palpations: Abdomen is soft. There is no mass.     Tenderness: There is no abdominal tenderness. There is no guarding.  Musculoskeletal:     Cervical back: Normal range of motion.   Lymphadenopathy:     Head:     Left side of head: Tonsillar adenopathy present. No preauricular or posterior auricular adenopathy.     Cervical: Cervical adenopathy present.     Left cervical: Superficial cervical adenopathy present.  Skin:    General: Skin is warm and dry.       Neurological:     Mental Status: Marilyn is alert and oriented to person, place, and time.  Psychiatric:        Behavior: Behavior normal.     ED Results / Procedures / Treatments   Labs (all labs ordered are listed, but only abnormal results are displayed) Labs Reviewed  COMPREHENSIVE METABOLIC PANEL - Abnormal; Notable for the following components:      Result Value   CO2 21 (*)    AST 43 (*)    ALT 45 (*)    All other components within normal limits  CBC WITH DIFFERENTIAL/PLATELET - Abnormal; Notable for the following components:   WBC 2.2 (*)    RBC 5.29 (*)    Hemoglobin 16.1 (*)    HCT 47.0 (*)    Neutro Abs 0.8 (*)    All other components within normal limits  RESP PANEL BY RT-PCR (FLU A&B, COVID) ARPGX2  ROCKY MTN SPOTTED FVR ABS PNL(IGG+IGM)  B. BURGDORFI ANTIBODIES  EHRLICHIA ANTIBODY PANEL  PATHOLOGIST SMEAR REVIEW  I-STAT BETA HCG BLOOD, ED ([facility], [facility], [facility] ONLY)    EKG None  Radiology No results found.  Procedures Procedures   Medications Ordered in ED Medications - No data to display  ED Course  I have reviewed the triage vital signs and the nursing notes.  Pertinent labs & imaging results that were available during my care of the patient were reviewed by me and considered in my medical decision making (see chart for details).    MDM Rules/Calculators/A&P                          Patient here with multiple complaints.  Chiefly Marilyn is here for flulike symptoms after tick bite 2 weeks ago.  Differential diagnosis includes a viral illness including COVID-19 for which Marilyn is positive today on review of her lab ThompsonShakeerah Tarr could also have infectious tickborne  illness including RMSF which I have low suspicion for based on lab results.  Rash is not consistent  with petechiae.  Others include Lyme disease, ehrlichiosis, tularemia.  Highly unlikely to be [place].  I discussed the case with Dr. Ilsa Iha who recommended testing for RMSF, Lyme and ehrlichiosis and will have her follow-up in their clinic for further evaluation for any positive results. I ordered and reviewed labs.  Patient has a mild leukopenia, CMP without significant abnormality.  Marilyn has a negative pregnancy test. Patient also here with complaints of popping sensation in her head leading to an episode of ataxia of and imbalance.  I discussed the case with Dr. Amada Jupiter of neurology who recommended imaging.  I ordered and reviewed MRA of the head neck and an MRI of the brain all of which show no acute abnormalities or evidence of aneurysm.  Patient appears stable and appropriate for discharge.  Marilyn has already had symptoms longer than 5 days and is up isolation precautions but should continue masking.  Marilyn is otherwise well-appearing and hemodynamically stable appears appropriate for discharge with infectious disease follow-up. Final Clinical Impression(s) / ED Diagnoses Final diagnoses:  None    Rx / DC Orders ED Discharge Orders    None       Arthor Captain, PA-C 12/28/20 1647    Horton, Clabe Seal, DO 12/29/20 2004

## 2020-12-28 ENCOUNTER — Telehealth: Payer: Self-pay | Admitting: Physician Assistant

## 2020-12-28 NOTE — Telephone Encounter (Signed)
Called to discuss with patient about COVID-19 symptoms and the use of one of the available treatments for those with mild to moderate Covid symptoms and at a high risk of hospitalization.  Pt appears to qualify for outpatient treatment due to co-morbid conditions and/or a member of an at-risk group in accordance with the FDA Emergency Use Authorization.    Symptom onset: unclear Vaccinated: no Booster? no Immunocompromised? no Qualifiers: SVI, unvaccinated NIH Criteria: 2  Unable to reach pt - left VM   Cline Crock

## 2020-12-30 LAB — EHRLICHIA ANTIBODY PANEL
E chaffeensis (HGE) Ab, IgG: NEGATIVE
E chaffeensis (HGE) Ab, IgM: NEGATIVE
E. Chaffeensis (HME) IgM Titer: NEGATIVE
E.Chaffeensis (HME) IgG: NEGATIVE

## 2020-12-30 LAB — PATHOLOGIST SMEAR REVIEW

## 2021-01-02 LAB — B. BURGDORFI ANTIBODIES

## 2021-01-02 LAB — MISC LABCORP TEST (SEND OUT): Labcorp test code: 164226

## 2021-01-06 LAB — ROCKY MTN SPOTTED FVR ABS PNL(IGG+IGM)
RMSF IgG: NEGATIVE
RMSF IgM: 1.29 index — ABNORMAL HIGH (ref 0.00–0.89)

## 2021-02-11 ENCOUNTER — Inpatient Hospital Stay: Payer: Medicaid Other | Admitting: Nurse Practitioner

## 2021-04-24 DIAGNOSIS — R1084 Generalized abdominal pain: Secondary | ICD-10-CM | POA: Diagnosis not present

## 2021-04-24 DIAGNOSIS — Z20822 Contact with and (suspected) exposure to covid-19: Secondary | ICD-10-CM | POA: Diagnosis not present

## 2021-04-24 DIAGNOSIS — Z0000 Encounter for general adult medical examination without abnormal findings: Secondary | ICD-10-CM | POA: Diagnosis not present

## 2021-04-24 DIAGNOSIS — Z1159 Encounter for screening for other viral diseases: Secondary | ICD-10-CM | POA: Diagnosis not present

## 2021-04-28 DIAGNOSIS — N926 Irregular menstruation, unspecified: Secondary | ICD-10-CM | POA: Diagnosis not present

## 2021-04-28 DIAGNOSIS — K273 Acute peptic ulcer, site unspecified, without hemorrhage or perforation: Secondary | ICD-10-CM | POA: Diagnosis not present

## 2021-04-28 DIAGNOSIS — R1084 Generalized abdominal pain: Secondary | ICD-10-CM | POA: Diagnosis not present

## 2021-04-28 DIAGNOSIS — R824 Acetonuria: Secondary | ICD-10-CM | POA: Diagnosis not present

## 2021-05-19 DIAGNOSIS — K5909 Other constipation: Secondary | ICD-10-CM | POA: Diagnosis not present

## 2021-05-19 DIAGNOSIS — R945 Abnormal results of liver function studies: Secondary | ICD-10-CM | POA: Diagnosis not present

## 2021-05-19 DIAGNOSIS — R1013 Epigastric pain: Secondary | ICD-10-CM | POA: Diagnosis not present

## 2021-05-19 DIAGNOSIS — K648 Other hemorrhoids: Secondary | ICD-10-CM | POA: Diagnosis not present

## 2021-05-21 DIAGNOSIS — R945 Abnormal results of liver function studies: Secondary | ICD-10-CM | POA: Diagnosis not present

## 2021-05-25 DIAGNOSIS — Z789 Other specified health status: Secondary | ICD-10-CM | POA: Diagnosis not present

## 2021-05-25 DIAGNOSIS — R1013 Epigastric pain: Secondary | ICD-10-CM | POA: Diagnosis not present

## 2021-05-25 DIAGNOSIS — F17210 Nicotine dependence, cigarettes, uncomplicated: Secondary | ICD-10-CM | POA: Diagnosis not present

## 2021-05-25 DIAGNOSIS — R319 Hematuria, unspecified: Secondary | ICD-10-CM | POA: Diagnosis not present

## 2021-05-26 DIAGNOSIS — Z1211 Encounter for screening for malignant neoplasm of colon: Secondary | ICD-10-CM | POA: Diagnosis not present

## 2021-05-26 DIAGNOSIS — R1013 Epigastric pain: Secondary | ICD-10-CM | POA: Diagnosis not present

## 2021-06-01 DIAGNOSIS — Z0000 Encounter for general adult medical examination without abnormal findings: Secondary | ICD-10-CM | POA: Diagnosis not present

## 2021-06-01 DIAGNOSIS — K200 Eosinophilic esophagitis: Secondary | ICD-10-CM | POA: Diagnosis not present

## 2021-06-01 DIAGNOSIS — N6019 Diffuse cystic mastopathy of unspecified breast: Secondary | ICD-10-CM | POA: Diagnosis not present

## 2021-06-09 DIAGNOSIS — J0100 Acute maxillary sinusitis, unspecified: Secondary | ICD-10-CM | POA: Diagnosis not present

## 2021-06-30 DIAGNOSIS — R945 Abnormal results of liver function studies: Secondary | ICD-10-CM | POA: Diagnosis not present

## 2021-06-30 DIAGNOSIS — K5909 Other constipation: Secondary | ICD-10-CM | POA: Diagnosis not present

## 2021-06-30 DIAGNOSIS — R1084 Generalized abdominal pain: Secondary | ICD-10-CM | POA: Diagnosis not present

## 2021-07-31 DIAGNOSIS — R6881 Early satiety: Secondary | ICD-10-CM | POA: Diagnosis not present

## 2021-07-31 DIAGNOSIS — N946 Dysmenorrhea, unspecified: Secondary | ICD-10-CM | POA: Diagnosis not present

## 2021-07-31 DIAGNOSIS — R102 Pelvic and perineal pain: Secondary | ICD-10-CM | POA: Diagnosis not present

## 2021-07-31 DIAGNOSIS — R140 Abdominal distension (gaseous): Secondary | ICD-10-CM | POA: Diagnosis not present

## 2021-07-31 DIAGNOSIS — Z8041 Family history of malignant neoplasm of ovary: Secondary | ICD-10-CM | POA: Diagnosis not present

## 2021-08-04 DIAGNOSIS — F17210 Nicotine dependence, cigarettes, uncomplicated: Secondary | ICD-10-CM | POA: Diagnosis not present

## 2021-08-04 DIAGNOSIS — Z8041 Family history of malignant neoplasm of ovary: Secondary | ICD-10-CM | POA: Diagnosis not present

## 2021-08-04 DIAGNOSIS — R140 Abdominal distension (gaseous): Secondary | ICD-10-CM | POA: Diagnosis not present

## 2021-08-04 DIAGNOSIS — N946 Dysmenorrhea, unspecified: Secondary | ICD-10-CM | POA: Diagnosis not present

## 2021-08-04 DIAGNOSIS — R6881 Early satiety: Secondary | ICD-10-CM | POA: Diagnosis not present

## 2021-08-04 DIAGNOSIS — R102 Pelvic and perineal pain: Secondary | ICD-10-CM | POA: Diagnosis not present

## 2021-08-04 DIAGNOSIS — Z888 Allergy status to other drugs, medicaments and biological substances status: Secondary | ICD-10-CM | POA: Diagnosis not present

## 2021-08-04 DIAGNOSIS — N920 Excessive and frequent menstruation with regular cycle: Secondary | ICD-10-CM | POA: Diagnosis not present

## 2021-12-17 DIAGNOSIS — O219 Vomiting of pregnancy, unspecified: Secondary | ICD-10-CM | POA: Diagnosis not present

## 2021-12-17 DIAGNOSIS — O99331 Smoking (tobacco) complicating pregnancy, first trimester: Secondary | ICD-10-CM | POA: Diagnosis not present

## 2021-12-17 DIAGNOSIS — N912 Amenorrhea, unspecified: Secondary | ICD-10-CM | POA: Diagnosis not present

## 2021-12-17 DIAGNOSIS — Z3689 Encounter for other specified antenatal screening: Secondary | ICD-10-CM | POA: Diagnosis not present

## 2021-12-17 DIAGNOSIS — Z3201 Encounter for pregnancy test, result positive: Secondary | ICD-10-CM | POA: Diagnosis not present

## 2022-01-13 DIAGNOSIS — Z3490 Encounter for supervision of normal pregnancy, unspecified, unspecified trimester: Secondary | ICD-10-CM | POA: Diagnosis not present

## 2022-01-13 DIAGNOSIS — Z31430 Encounter of female for testing for genetic disease carrier status for procreative management: Secondary | ICD-10-CM | POA: Diagnosis not present

## 2022-01-13 DIAGNOSIS — O09892 Supervision of other high risk pregnancies, second trimester: Secondary | ICD-10-CM | POA: Diagnosis not present

## 2022-02-10 DIAGNOSIS — Z3689 Encounter for other specified antenatal screening: Secondary | ICD-10-CM | POA: Diagnosis not present

## 2022-03-04 DIAGNOSIS — Z3689 Encounter for other specified antenatal screening: Secondary | ICD-10-CM | POA: Diagnosis not present

## 2022-04-29 DIAGNOSIS — O26891 Other specified pregnancy related conditions, first trimester: Secondary | ICD-10-CM | POA: Diagnosis not present

## 2022-04-29 DIAGNOSIS — Z6791 Unspecified blood type, Rh negative: Secondary | ICD-10-CM | POA: Diagnosis not present

## 2022-04-29 DIAGNOSIS — Z23 Encounter for immunization: Secondary | ICD-10-CM | POA: Diagnosis not present

## 2022-04-29 DIAGNOSIS — Z3689 Encounter for other specified antenatal screening: Secondary | ICD-10-CM | POA: Diagnosis not present

## 2022-06-09 DIAGNOSIS — O26891 Other specified pregnancy related conditions, first trimester: Secondary | ICD-10-CM | POA: Diagnosis not present

## 2022-06-09 DIAGNOSIS — Z6791 Unspecified blood type, Rh negative: Secondary | ICD-10-CM | POA: Diagnosis not present

## 2022-06-09 DIAGNOSIS — Z3A32 32 weeks gestation of pregnancy: Secondary | ICD-10-CM | POA: Diagnosis not present

## 2022-06-16 DIAGNOSIS — Z3689 Encounter for other specified antenatal screening: Secondary | ICD-10-CM | POA: Diagnosis not present

## 2022-06-16 DIAGNOSIS — Z3685 Encounter for antenatal screening for Streptococcus B: Secondary | ICD-10-CM | POA: Diagnosis not present

## 2022-06-30 DIAGNOSIS — O479 False labor, unspecified: Secondary | ICD-10-CM | POA: Diagnosis not present

## 2022-06-30 DIAGNOSIS — Z6791 Unspecified blood type, Rh negative: Secondary | ICD-10-CM | POA: Diagnosis not present

## 2022-06-30 DIAGNOSIS — Z3A37 37 weeks gestation of pregnancy: Secondary | ICD-10-CM | POA: Diagnosis not present

## 2022-06-30 DIAGNOSIS — Z3483 Encounter for supervision of other normal pregnancy, third trimester: Secondary | ICD-10-CM | POA: Diagnosis not present

## 2022-07-03 DIAGNOSIS — O99891 Other specified diseases and conditions complicating pregnancy: Secondary | ICD-10-CM | POA: Diagnosis not present

## 2022-07-03 DIAGNOSIS — O3483 Maternal care for other abnormalities of pelvic organs, third trimester: Secondary | ICD-10-CM | POA: Diagnosis not present

## 2022-07-03 DIAGNOSIS — Z3A37 37 weeks gestation of pregnancy: Secondary | ICD-10-CM | POA: Diagnosis not present

## 2022-07-03 DIAGNOSIS — R102 Pelvic and perineal pain: Secondary | ICD-10-CM | POA: Diagnosis not present

## 2022-07-11 DIAGNOSIS — Z3A39 39 weeks gestation of pregnancy: Secondary | ICD-10-CM | POA: Diagnosis not present

## 2022-07-11 DIAGNOSIS — O26893 Other specified pregnancy related conditions, third trimester: Secondary | ICD-10-CM | POA: Diagnosis not present

## 2022-07-11 DIAGNOSIS — F17210 Nicotine dependence, cigarettes, uncomplicated: Secondary | ICD-10-CM | POA: Diagnosis not present

## 2022-07-11 DIAGNOSIS — O26853 Spotting complicating pregnancy, third trimester: Secondary | ICD-10-CM | POA: Diagnosis not present

## 2022-07-11 DIAGNOSIS — Z6741 Type O blood, Rh negative: Secondary | ICD-10-CM | POA: Diagnosis not present

## 2022-07-11 DIAGNOSIS — Z885 Allergy status to narcotic agent status: Secondary | ICD-10-CM | POA: Diagnosis not present

## 2022-07-11 DIAGNOSIS — Z6791 Unspecified blood type, Rh negative: Secondary | ICD-10-CM | POA: Diagnosis not present

## 2022-07-11 DIAGNOSIS — O99334 Smoking (tobacco) complicating childbirth: Secondary | ICD-10-CM | POA: Diagnosis not present
